# Patient Record
Sex: Female | Born: 1955 | Race: White | Hispanic: No | Marital: Married | Smoking: Never smoker
Health system: Southern US, Community
[De-identification: ages and names within clinical notes are randomized; demographics above are authoritative.]

## PROBLEM LIST (undated history)

## (undated) DIAGNOSIS — K219 Gastro-esophageal reflux disease without esophagitis: Secondary | ICD-10-CM

## (undated) DIAGNOSIS — B019 Varicella without complication: Secondary | ICD-10-CM

## (undated) HISTORY — DX: Gastro-esophageal reflux disease without esophagitis: K21.9

## (undated) HISTORY — PX: GALLBLADDER SURGERY: SHX652

## (undated) HISTORY — PX: ABDOMINAL HYSTERECTOMY: SHX81

## (undated) HISTORY — PX: TONSILLECTOMY: SUR1361

## (undated) HISTORY — DX: Varicella without complication: B01.9

## (undated) HISTORY — PX: REPLACEMENT TOTAL KNEE: SUR1224

---

## 2020-02-01 ENCOUNTER — Other Ambulatory Visit: Payer: Self-pay

## 2020-02-02 ENCOUNTER — Encounter: Payer: Self-pay | Admitting: Family Medicine

## 2020-02-02 ENCOUNTER — Encounter (INDEPENDENT_AMBULATORY_CARE_PROVIDER_SITE_OTHER): Payer: Self-pay

## 2020-02-02 ENCOUNTER — Ambulatory Visit (INDEPENDENT_AMBULATORY_CARE_PROVIDER_SITE_OTHER): Payer: BC Managed Care – PPO | Admitting: Family Medicine

## 2020-02-02 VITALS — BP 136/82 | HR 69 | Temp 97.0°F | Ht 66.25 in | Wt 225.2 lb

## 2020-02-02 DIAGNOSIS — Z0000 Encounter for general adult medical examination without abnormal findings: Secondary | ICD-10-CM | POA: Diagnosis not present

## 2020-02-02 DIAGNOSIS — Z23 Encounter for immunization: Secondary | ICD-10-CM

## 2020-02-02 DIAGNOSIS — Z1231 Encounter for screening mammogram for malignant neoplasm of breast: Secondary | ICD-10-CM | POA: Diagnosis not present

## 2020-02-02 DIAGNOSIS — R140 Abdominal distension (gaseous): Secondary | ICD-10-CM | POA: Diagnosis not present

## 2020-02-02 DIAGNOSIS — E669 Obesity, unspecified: Secondary | ICD-10-CM | POA: Diagnosis not present

## 2020-02-02 DIAGNOSIS — K219 Gastro-esophageal reflux disease without esophagitis: Secondary | ICD-10-CM

## 2020-02-02 DIAGNOSIS — N951 Menopausal and female climacteric states: Secondary | ICD-10-CM | POA: Diagnosis not present

## 2020-02-02 LAB — COMPREHENSIVE METABOLIC PANEL
ALT: 25 U/L (ref 0–35)
AST: 20 U/L (ref 0–37)
Albumin: 4.5 g/dL (ref 3.5–5.2)
Alkaline Phosphatase: 62 U/L (ref 39–117)
BUN: 16 mg/dL (ref 6–23)
CO2: 31 mEq/L (ref 19–32)
Calcium: 9.7 mg/dL (ref 8.4–10.5)
Chloride: 103 mEq/L (ref 96–112)
Creatinine, Ser: 1.04 mg/dL (ref 0.40–1.20)
GFR: 53.69 mL/min — ABNORMAL LOW (ref >60.00)
Glucose, Bld: 105 mg/dL — ABNORMAL HIGH (ref 70–99)
Potassium: 4.3 mEq/L (ref 3.5–5.1)
Sodium: 140 mEq/L (ref 135–145)
Total Bilirubin: 0.6 mg/dL (ref 0.2–1.2)
Total Protein: 6.4 g/dL (ref 6.0–8.3)

## 2020-02-02 LAB — CBC WITH DIFFERENTIAL/PLATELET
Basophils Absolute: 0 10*3/uL (ref 0.0–0.1)
Basophils Relative: 0.7 % (ref 0.0–3.0)
Eosinophils Absolute: 0.1 10*3/uL (ref 0.0–0.7)
Eosinophils Relative: 1.8 % (ref 0.0–5.0)
HCT: 45.7 % (ref 36.0–46.0)
Hemoglobin: 15.6 g/dL — ABNORMAL HIGH (ref 12.0–15.0)
Lymphocytes Relative: 29.8 % (ref 12.0–46.0)
Lymphs Abs: 1.2 10*3/uL (ref 0.7–4.0)
MCHC: 34 g/dL (ref 30.0–36.0)
MCV: 96 fl (ref 78.0–100.0)
Monocytes Absolute: 0.3 10*3/uL (ref 0.1–1.0)
Monocytes Relative: 8.2 % (ref 3.0–12.0)
Neutro Abs: 2.4 10*3/uL (ref 1.4–7.7)
Neutrophils Relative %: 59.5 % (ref 43.0–77.0)
Platelets: 184 10*3/uL (ref 150.0–400.0)
RBC: 4.76 Mil/uL (ref 3.87–5.11)
RDW: 13.1 % (ref 11.5–15.5)
WBC: 4 10*3/uL (ref 4.0–10.5)

## 2020-02-02 LAB — HEMOGLOBIN A1C: Hgb A1c MFr Bld: 5.9 % (ref 4.6–6.5)

## 2020-02-02 LAB — LIPID PANEL
Cholesterol: 238 mg/dL — ABNORMAL HIGH (ref 0–200)
HDL: 47.5 mg/dL (ref >39.00)
LDL Cholesterol: 158 mg/dL — ABNORMAL HIGH (ref 0–99)
NonHDL: 190.71
Total CHOL/HDL Ratio: 5
Triglycerides: 165 mg/dL — ABNORMAL HIGH (ref 0.0–149.0)
VLDL: 33 mg/dL (ref 0.0–40.0)

## 2020-02-02 LAB — TSH: TSH: 1.36 u[IU]/mL (ref 0.35–4.50)

## 2020-02-02 NOTE — Patient Instructions (Addendum)
Please return in 12 months for your annual complete physical; please come fasting.  Please sign up for Mychart.  I will release your lab results to you on your MyChart account with further instructions. Please reply with any questions.   Today you were given your tdap vaccination. It is good for 10 years.   You may try OTC Black cohosh twice a day and Valerian root nightly to help with your hot flushes and sleep.   I have ordered a mammogram and/or bone density for you as we discussed today: [x]   Mammogram  []   Bone Density  Please call the office checked below to schedule your appointment: Your appointment will at the following location  [x]   [facility]      [place], [place]         []   [facility]  [place], [place]  Make sure to wear two peace clothing  No lotions powders or deodorants the day of the appointment Make sure to bring picture ID and insurance card.  Bring list of medications you are currently taking including any supplements.   It was a pleasure meeting you today! Thank you for choosing [place] to meet your healthcare needs! I truly look forward to working with you. If you have any questions or concerns, please send me a message via Mychart or call the office at [phone].   Preventive Care 42-34 Years Old, Female Preventive care refers to visits with your health care provider and lifestyle choices that can promote health and wellness. This includes:  A yearly physical exam. This may also be called an annual well check.  Regular dental visits and eye exams.  Immunizations.  Screening for certain conditions.  Healthy lifestyle choices, such as eating a healthy diet, getting regular exercise, not using drugs or products that contain nicotine and tobacco, and limiting alcohol use. What can I expect for my preventive care visit? Physical exam Your  health care provider will check your:  Height and weight. This may be used to calculate body mass index (BMI), which tells if you are at a healthy weight.  Heart rate and blood pressure.  Skin for abnormal spots. Counseling Your health care provider may ask you questions about your:  Alcohol, tobacco, and drug use.  Emotional well-being.  Home and relationship well-being.  Sexual activity.  Eating habits.  Work and work Statistician.  Method of birth control.  Menstrual cycle.  Pregnancy history. What immunizations do I need?  Influenza (flu) vaccine  This is recommended every year. Tetanus, diphtheria, and pertussis (Tdap) vaccine  You may need a Td booster every 10 years. Varicella (chickenpox) vaccine  You may need this if you have not been vaccinated. Zoster (shingles) vaccine  You may need this after age 53. Measles, mumps, and rubella (MMR) vaccine  You may need at least one dose of MMR if you were born in 1957 or later. You may also need a second dose. Pneumococcal conjugate (PCV13) vaccine  You may need this if you have certain conditions and were not previously vaccinated. Pneumococcal polysaccharide (PPSV23) vaccine  You may need one or two doses if you smoke cigarettes or if you have certain conditions. Meningococcal conjugate (MenACWY) vaccine  You may need this if you have certain conditions. Hepatitis A vaccine  You may need this if you  have certain conditions or if you travel or work in places where you may be exposed to hepatitis A. Hepatitis B vaccine  You may need this if you have certain conditions or if you travel or work in places where you may be exposed to hepatitis B. Haemophilus influenzae type b (Hib) vaccine  You may need this if you have certain conditions. Human papillomavirus (HPV) vaccine  If recommended by your health care provider, you may need three doses over 6 months. You may receive vaccines as individual doses or as  more than one vaccine together in one shot (combination vaccines). Talk with your health care provider about the risks and benefits of combination vaccines. What tests do I need? Blood tests  Lipid and cholesterol levels. These may be checked every 5 years, or more frequently if you are over 81 years old.  Hepatitis C test.  Hepatitis B test. Screening  Lung cancer screening. You may have this screening every year starting at age 43 if you have a 30-pack-year history of smoking and currently smoke or have quit within the past 15 years.  Colorectal cancer screening. All adults should have this screening starting at age 71 and continuing until age 24. Your health care provider may recommend screening at age 43 if you are at increased risk. You will have tests every 1-10 years, depending on your results and the type of screening test.  Diabetes screening. This is done by checking your blood sugar (glucose) after you have not eaten for a while (fasting). You may have this done every 1-3 years.  Mammogram. This may be done every 1-2 years. Talk with your health care provider about when you should start having regular mammograms. This may depend on whether you have a family history of breast cancer.  BRCA-related cancer screening. This may be done if you have a family history of breast, ovarian, tubal, or peritoneal cancers.  Pelvic exam and Pap test. This may be done every 3 years starting at age 47. Starting at age 22, this may be done every 5 years if you have a Pap test in combination with an HPV test. Other tests  Sexually transmitted disease (STD) testing.  Bone density scan. This is done to screen for osteoporosis. You may have this scan if you are at high risk for osteoporosis. Follow these instructions at home: Eating and drinking  Eat a diet that includes fresh fruits and vegetables, whole grains, lean protein, and low-fat dairy.  Take vitamin and mineral supplements as  recommended by your health care provider.  Do not drink alcohol if: ? Your health care provider tells you not to drink. ? You are pregnant, may be pregnant, or are planning to become pregnant.  If you drink alcohol: ? Limit how much you have to 0-1 drink a day. ? Be aware of how much alcohol is in your drink. In the [place] one drink equals one 12 oz bottle of beer (355 mL), one 5 oz glass of wine (148 mL), or one 1 oz glass of hard liquor (44 mL). Lifestyle  Take daily care of your teeth and gums.  Stay active. Exercise for at least 30 minutes on 5 or more days each week.  Do not use any products that contain nicotine or tobacco, such as cigarettes, e-cigarettes, and chewing tobacco. If you need help quitting, ask your health care provider.  If you are sexually active, practice safe sex. Use a condom or other form of birth control (contraception)  in order to prevent pregnancy and STIs (sexually transmitted infections).  If told by your health care provider, take low-dose aspirin daily starting at age 49. What's next?  Visit your health care provider once a year for a well check visit.  Ask your health care provider how often you should have your eyes and teeth checked.  Stay up to date on all vaccines. This information is not intended to replace advice given to you by your health care provider. Make sure you discuss any questions you have with your health care provider. Document Revised: 07/23/2018 Document Reviewed: 07/23/2018 Elsevier Patient Education  [place].   Irritable Bowel Syndrome, Adult  Irritable bowel syndrome (IBS) is a group of symptoms that affects the organs responsible for digestion (gastrointestinal or GI tract). IBS is not one specific disease. To regulate how the GI tract works, the body sends signals back and forth between the intestines and the brain. If you have IBS, there may be a problem with these signals. As a result, the GI tract does not  function normally. The intestines may become more sensitive and overreact to certain things. This may be especially true when you eat certain foods or when you are under stress. There are four types of IBS. These may be determined based on the consistency of your stool (feces):  IBS with diarrhea.  IBS with constipation.  Mixed IBS.  Unsubtyped IBS. It is important to know which type of IBS you have. Certain treatments are more likely to be helpful for certain types of IBS. What are the causes? The exact cause of IBS is not known. What increases the risk? You may have a higher risk for IBS if you:  Are female.  Are younger than 23.  Have a family history of IBS.  Have a mental health condition, such as depression, anxiety, or post-traumatic stress disorder.  Have had a bacterial infection of your GI tract. What are the signs or symptoms? Symptoms of IBS vary from person to person. The main symptom is abdominal pain or discomfort. Other symptoms usually include one or more of the following:  Diarrhea, constipation, or both.  Abdominal swelling or bloating.  Feeling full after eating a small or regular-sized meal.  Frequent gas.  Mucus in the stool.  A feeling of having more stool left after a bowel movement. Symptoms tend to come and go. They may be triggered by stress, mental health conditions, or certain foods. How is this diagnosed? This condition may be diagnosed based on a physical exam, your medical history, and your symptoms. You may have tests, such as:  Blood tests.  Stool test.  X-rays.  CT scan.  Colonoscopy. This is a procedure in which your GI tract is viewed with a long, thin, flexible tube. How is this treated? There is no cure for IBS, but treatment can help relieve symptoms. Treatment depends on the type of IBS you have, and may include:  Changes to your diet, such as: ? Avoiding foods that cause symptoms. ? Drinking more water. ? Following a  low-FODMAP (fermentable oligosaccharides, disaccharides, monosaccharides, and polyols) diet for up to 6 weeks, or as told by your health care provider. FODMAPs are sugars that are hard for some people to digest. ? Eating more fiber. ? Eating medium-sized meals at the same times every day.  Medicines. These may include: ? Fiber supplements, if you have constipation. ? Medicine to control diarrhea (antidiarrheal medicines). ? Medicine to help control muscle tightening (spasms)  in your GI tract (antispasmodic medicines). ? Medicines to help with mental health conditions, such as antidepressants or tranquilizers.  Talk therapy or counseling.  Working with a diet and nutrition specialist (dietitian) to help create a food plan that is right for you.  Managing your stress. Follow these instructions at home: Eating and drinking  Eat a healthy diet.  Eat medium-sized meals at about the same time every day. Do not eat large meals.  Gradually eat more fiber-rich foods. These include whole grains, fruits, and vegetables. This may be especially helpful if you have IBS with constipation.  Eat a diet low in FODMAPs.  Drink enough fluid to keep your urine pale yellow.  Keep a journal of foods that seem to trigger symptoms.  Avoid foods and drinks that: ? Contain added sugar. ? Make your symptoms worse. Dairy products, caffeinated drinks, and carbonated drinks can make symptoms worse for some people. General instructions  Take over-the-counter and prescription medicines and supplements only as told by your health care provider.  Get enough exercise. Do at least 150 minutes of moderate-intensity exercise each week.  Manage your stress. Getting enough sleep and exercise can help you manage stress.  Keep all follow-up visits as told by your health care provider and therapist. This is important. Alcohol Use  Do not drink alcohol if: ? Your health care provider tells you not to drink. ? You  are pregnant, may be pregnant, or are planning to become pregnant.  If you drink alcohol, limit how much you have: ? 0-1 drink a day for women. ? 0-2 drinks a day for men.  Be aware of how much alcohol is in your drink. In the [place] one drink equals one typical bottle of beer (12 oz), one-half glass of wine (5 oz), or one shot of hard liquor (1 oz). Contact a health care provider if you have:  Constant pain.  Weight loss.  Difficulty or pain when swallowing.  Diarrhea that gets worse. Get help right away if you have:  Severe abdominal pain.  Fever.  Diarrhea with symptoms of dehydration, such as dizziness or dry mouth.  Bright red blood in your stool.  Stool that is black and tarry.  Abdominal swelling.  Vomiting that does not stop.  Blood in your vomit. Summary  Irritable bowel syndrome (IBS) is not one specific disease. It is a group of symptoms that affects digestion.  Your intestines may become more sensitive and overreact to certain things. This may be especially true when you eat certain foods or when you are under stress.  There is no cure for IBS, but treatment can help relieve symptoms. This information is not intended to replace advice given to you by your health care provider. Make sure you discuss any questions you have with your health care provider. Document Revised: 11/04/2017 Document Reviewed: 11/04/2017 Elsevier Patient Education  2020 [employer].

## 2020-02-02 NOTE — Progress Notes (Signed)
Subjective  Chief Complaint  Patient presents with  . New Patient (Initial Visit)    moved form [place]. seen Dr. Gerhard Perches and Dr. Lethea Killings in 2019. CPE done over 2 years ago  . Gastroesophageal Reflux    bloating for years. does not take any medication for GERD    HPI: Brittany Lamb is a 64 y.o. female who presents to [facility] [place] today for a Female Wellness Visit. She also has the concerns and/or needs as listed above in the chief complaint. These will be addressed in addition to the Health Maintenance Visit. New pt to establish care.   Wellness Visit: annual visit with health maintenance review and exam without Pap   HM: last cpe 2 years ago. No records for review. Struggles with weight: chronically. Due for mammogram, tdap. Reports had colonoscopy but not sure when (believes 2 years ago) and it was normal. Will request records.  Chronic disease f/u and/or acute problem visit: (deemed necessary to be done in addition to the wellness visit):  Bloating: chronically > 10 years. Evaluated in past by GI and surgeon. Had cholecystectomy w/o change in sxs. Had EGD and colonoscopy. Reports both normal. Variable. Struggles some with intermittent constipation. No blood in stool. Can't make a pattern to sxs. No cramping or diarrhea. No change in abdominal girth. S/o complete hysterectomy  GERD: reports heartburn and reflux. Has been on h2blockers and PPIs, multiple w/o change in sxs. Protonix gave her leg cramps. No h/o PUD.   Premature menopause early 3s; then had complete hysterectomy for bleeding; + uterine cancer in both sister and mother. Has persistent hot flushes. Prefers herbal or alternative supplements over RX meds. Takes melatonin for sleep.   Assessment  1. Annual physical exam   2. Encounter for screening mammogram for breast cancer   3. Need for Tdap vaccination   4. Obesity (BMI 30-39.9)   5. Abdominal bloating   6. Gastroesophageal reflux  disease without esophagitis   7. Menopausal hot flushes      Plan  Female Wellness Visit:  Age appropriate Health Maintenance and Prevention measures were discussed with patient. Included topics are cancer screening recommendations, ways to keep healthy (see AVS) including dietary and exercise recommendations, regular eye and dental care, use of seat belts, and avoidance of moderate alcohol use and tobacco use. mammo ordered  BMI: discussed patient's BMI and encouraged positive lifestyle modifications to help get to or maintain a target BMI. rec weight loss  HM needs and immunizations were addressed and ordered. See below for orders. See HM and immunization section for updates. tdap today. Declines flu shot  Routine labs and screening tests ordered including cmp, cbc and lipids where appropriate.  Discussed recommendations regarding Vit D and calcium supplementation (see AVS)  Chronic disease management visit and/or acute problem visit:  GERD: ? Treatment options since nothing has been helpful and reportedly normal EGD. May need GI referral. Will request records first.   Bloating and constipation: most c/w IBS sxs. Trial of probiotics and daily miralax.   Hot flushes: trial of black cohosh and valerian root.   Follow up: Return in about 1 year (around 02/01/2021) for complete physical.  Orders Placed This Encounter  Procedures  . MM 3D SCREEN BREAST BILATERAL  . Tdap vaccine greater than or equal to 7yo IM  . CBC with Differential/Platelet  . Comprehensive metabolic panel  . Lipid panel  . HIV Antibody (routine testing w rflx)  . Hepatitis  C antibody  . TSH  . Hemoglobin A1c   No orders of the defined types were placed in this encounter.     Lifestyle: Body mass index is 36.07 kg/m. Wt Readings from Last 3 Encounters:  02/02/20 225 lb 3.2 oz (102.2 kg)     There are no problems to display for this patient.  Health Maintenance  Topic Date Due  . Hepatitis C  Screening  27-Oct-1956  . HIV Screening  01/20/1973  . TETANUS/TDAP  01/20/1977  . MAMMOGRAM  01/21/2008  . INFLUENZA VACCINE  02/23/2020 (Originally 06/26/2019)  . COLONOSCOPY  11/25/2026   There is no immunization history for the selected administration types on file for this patient. We updated and reviewed the patient's past history in detail and it is documented below. Allergies: Patient is allergic to penicillins. Past Medical History Patient  has a past medical history of Chicken pox and GERD (gastroesophageal reflux disease). Past Surgical History Patient  has a past surgical history that includes Abdominal hysterectomy; Replacement total knee (Left); Gallbladder surgery; and Tonsillectomy. Family History: Patient family history includes Arthritis in her brother, father, mother, sister, and son; Cancer in her mother and sister; Diabetes in her brother, mother, and sister; High Cholesterol in her brother, mother, and sister; High blood pressure in her brother, father, mother, and sister; Stroke in her father. Social History:  Patient  reports that she has never smoked. She has never used smokeless tobacco. She reports current alcohol use. She reports that she does not use drugs.  Review of Systems: Constitutional: negative for fever or malaise Ophthalmic: negative for photophobia, double vision or loss of vision Cardiovascular: negative for chest pain, dyspnea on exertion, or new LE swelling Respiratory: negative for SOB or persistent cough Gastrointestinal: + for abdominal pain, change in bowel habits or melena Genitourinary: negative for dysuria or gross hematuria, no abnormal uterine bleeding or disharge Musculoskeletal: negative for new gait disturbance or muscular weakness Integumentary: negative for new or persistent rashes, no breast lumps Neurological: negative for TIA or stroke symptoms Psychiatric: negative for SI or delusions Allergic/Immunologic: negative for  hives  Patient Care Team    Relationship Specialty Notifications Start End  Willow Ora, MD PCP - General Family Medicine  02/02/20     Objective  Vitals: BP 136/82 (BP Location: Right Arm, Patient Position: Sitting, Cuff Size: Normal)   Pulse 69   Temp (!) 97 F (36.1 C) (Temporal)   Ht 5' 6.25" (1.683 m)   Wt 225 lb 3.2 oz (102.2 kg)   SpO2 95%   BMI 36.07 kg/m  General:  Well developed, well nourished, no acute distress  Psych:  Alert and orientedx3,normal mood and affect HEENT:  Normocephalic, atraumatic, non-icteric sclera, PERRL, supple neck without adenopathy, mass or thyromegaly Cardiovascular:  Normal S1, S2, RRR without gallop, rub or murmur, nondisplaced PMI Respiratory:  Good breath sounds bilaterally, CTAB with normal respiratory effort Gastrointestinal: normal bowel sounds, soft, non-tender, no noted masses. No HSM MSK: no deformities, contusions. Joints are without erythema or swelling.  Skin:  Warm, no rashes or suspicious lesions noted Neurologic:    Mental status is normal. CN 2-11 are normal. Gross motor and sensory exams are normal. Normal gait. No tremor Breast Exam: No mass, skin retraction or nipple discharge is appreciated in either breast. No axillary adenopathy. Fibrocystic changes are not noted   Commons side effects, risks, benefits, and alternatives for medications and treatment plan prescribed today were discussed, and the patient expressed understanding of  the given instructions. Patient is instructed to call or message via MyChart if he/she has any questions or concerns regarding our treatment plan. No barriers to understanding were identified. We discussed Red Flag symptoms and signs in detail. Patient expressed understanding regarding what to do in case of urgent or emergency type symptoms.   Medication list was reconciled, printed and provided to the patient in AVS. Patient instructions and summary information was reviewed with the patient as  documented in the AVS. This note was prepared with assistance of Dragon voice recognition software. Occasional wrong-word or sound-a-like substitutions may have occurred due to the inherent limitations of voice recognition software  This visit occurred during the SARS-CoV-2 public health emergency.  Safety protocols were in place, including screening questions prior to the visit, additional usage of staff PPE, and extensive cleaning of exam room while observing appropriate contact time as indicated for disinfecting solutions.

## 2020-02-03 LAB — HEPATITIS C ANTIBODY
Hepatitis C Ab: NONREACTIVE
SIGNAL TO CUT-OFF: 0 (ref <1.00)

## 2020-02-03 LAB — HIV ANTIBODY (ROUTINE TESTING W REFLEX): HIV 1&2 Ab, 4th Generation: NONREACTIVE

## 2020-02-04 ENCOUNTER — Encounter: Payer: Self-pay | Admitting: Family Medicine

## 2020-02-04 NOTE — Progress Notes (Signed)
Please call patient: I have reviewed his/her lab results. All lab results look ok, however sugar is just above normal and cholesterol levels are high. Neither yet warrant medication, but eating an improved diet without fats, sugars and processed foods will prevent this from worsening. Can send her a copy of results if she'd like; she did not sign up for mychart.  The 10-year ASCVD risk score Denman George DC Montez Hageman., et al., 2013) is: 5.6%   Values used to calculate the score:     Age: 15 years     Sex: Female     Is Non-Hispanic African American: No     Diabetic: No     Tobacco smoker: No     Systolic Blood Pressure: 136 mmHg     Is BP treated: No     HDL Cholesterol: 47.5 mg/dL     Total Cholesterol: 238 mg/dL

## 2020-02-07 ENCOUNTER — Telehealth: Payer: Self-pay | Admitting: Family Medicine

## 2020-02-07 NOTE — Telephone Encounter (Signed)
Error

## 2020-02-15 ENCOUNTER — Ambulatory Visit: Payer: Self-pay | Admitting: Family Medicine

## 2020-04-28 ENCOUNTER — Ambulatory Visit: Payer: Self-pay | Admitting: Family Medicine

## 2020-08-09 ENCOUNTER — Ambulatory Visit: Payer: BC Managed Care – PPO | Admitting: Family Medicine

## 2020-08-14 ENCOUNTER — Ambulatory Visit: Payer: Managed Care, Other (non HMO) | Admitting: Family Medicine

## 2020-08-14 DIAGNOSIS — Z0289 Encounter for other administrative examinations: Secondary | ICD-10-CM

## 2020-08-16 ENCOUNTER — Other Ambulatory Visit: Payer: Self-pay

## 2020-08-16 ENCOUNTER — Ambulatory Visit (INDEPENDENT_AMBULATORY_CARE_PROVIDER_SITE_OTHER): Payer: Managed Care, Other (non HMO) | Admitting: Family Medicine

## 2020-08-16 ENCOUNTER — Encounter: Payer: Self-pay | Admitting: Family Medicine

## 2020-08-16 VITALS — BP 118/88 | HR 70 | Temp 98.4°F | Wt 231.8 lb

## 2020-08-16 DIAGNOSIS — E669 Obesity, unspecified: Secondary | ICD-10-CM

## 2020-08-16 DIAGNOSIS — K581 Irritable bowel syndrome with constipation: Secondary | ICD-10-CM

## 2020-08-16 DIAGNOSIS — R0789 Other chest pain: Secondary | ICD-10-CM | POA: Diagnosis not present

## 2020-08-16 DIAGNOSIS — K219 Gastro-esophageal reflux disease without esophagitis: Secondary | ICD-10-CM | POA: Insufficient documentation

## 2020-08-16 DIAGNOSIS — N951 Menopausal and female climacteric states: Secondary | ICD-10-CM

## 2020-08-16 DIAGNOSIS — R140 Abdominal distension (gaseous): Secondary | ICD-10-CM

## 2020-08-16 MED ORDER — LUBIPROSTONE 8 MCG PO CAPS: 8.0000 ug | ORAL_CAPSULE | Freq: Two times a day (BID) | ORAL | 2 refills | Status: DC

## 2020-08-16 NOTE — Patient Instructions (Addendum)
Please return in March 2022 for complete physical.  If you have any questions or concerns, please don't hesitate to send me a message via MyChart or call the office at [phone]. Thank you for visiting with [place] today! It's our pleasure caring for you.  I have placed a referral to GI to further evaluate your symptoms and offer treatment. I have ordered a medication to see if it helps with your constipation and bloating symptoms.  I have ordered a treadmill stress test as well. We will call you to get you scheduled.   Try this diet to help with the bloating. This is good for IBS. FODMAP stands for "fermentable oligodi-monosaccharides and polyols," or, more simply, certain types of carbohydrates found in foods that are hard to digest. By following FODMAP, also known as a low-FODMAP diet, you avoid or limit these particular carbohydrates. Some of the foods that contain FODMAPs include: - Fruits such as apples, apricots, blackberries, cherries, mango, nectarines, pears, plums, and watermelon, or juice containing any of these fruits - Canned fruit in natural fruit juice, or large quantities of fruit juice or dried fruit - Vegentables such as artichokes, asparagus, beans, cabbage, cauliflower, garlic and garlic salts, lentils, mushrooms, onions, and sugar snap or snow peas - Dairy products such as milk, milk products, soft cheese, yogurt, custard, and ice cream - Wheat and rye products - Honey and foods with high-fructose corn syrup  - Products, including candy and gum, with sweeteners ending in "-ol" (for example, sorbitol, mannitol, xylitol, and maltitol)   My favorite websites for more information is: FindScifi.com.ee   Low-FODMAP Eating Plan  FODMAPs (fermentable oligosaccharides, disaccharides, monosaccharides, and polyols) are sugars that are hard for some people to digest. A low-FODMAP eating plan may help some people who have bowel (intestinal) diseases to manage their  symptoms. This meal plan can be complicated to follow. Work with a diet and nutrition specialist (dietitian) to make a low-FODMAP eating plan that is right for you. A dietitian can make sure that you get enough nutrition from this diet. What are tips for following this plan? Reading food labels  Check labels for hidden FODMAPs such as: ? High-fructose syrup. ? Honey. ? Agave. ? Natural fruit flavors. ? Onion or garlic powder.  Choose low-FODMAP foods that contain 3-4 grams of fiber per serving.  Check food labels for serving sizes. Eat only one serving at a time to make sure FODMAP levels stay low. Meal planning  Follow a low-FODMAP eating plan for up to 6 weeks, or as told by your health care provider or dietitian.  To follow the eating plan: 1. Eliminate high-FODMAP foods from your diet completely. 2. Gradually reintroduce high-FODMAP foods into your diet one at a time. Most people should wait a few days after introducing one high-FODMAP food before they introduce the next high-FODMAP food. Your dietitian can recommend how quickly you may reintroduce foods. 3. Keep a daily record of what you eat and drink, and make note of any symptoms that you have after eating. 4. Review your daily record with a dietitian regularly. Your dietitian can help you identify which foods you can eat and which foods you should avoid. General tips  Drink enough fluid each day to keep your urine pale yellow.  Avoid processed foods. These often have added sugar and may be high in FODMAPs.  Avoid most dairy products, whole grains, and sweeteners.  Work with a dietitian to make sure you get enough fiber in your diet. Recommended  foods Grains  Gluten-free grains, such as rice, oats, buckwheat, quinoa, corn, polenta, and millet. Gluten-free pasta, bread, or cereal. Rice noodles. Corn tortillas. Vegetables  Eggplant, zucchini, cucumber, peppers, green beans, Brussels sprouts, bean sprouts, lettuce,  arugula, kale, Swiss chard, spinach, collard greens, bok choy, summer squash, potato, and tomato. Limited amounts of corn, carrot, and sweet potato. Green parts of scallions. Fruits  Bananas, oranges, lemons, limes, blueberries, raspberries, strawberries, grapes, cantaloupe, honeydew melon, kiwi, papaya, passion fruit, and pineapple. Limited amounts of dried cranberries, banana chips, and shredded coconut. Dairy  Lactose-free milk, yogurt, and kefir. Lactose-free cottage cheese and ice cream. Non-dairy milks, such as almond, coconut, hemp, and rice milk. Yogurts made of non-dairy milks. Limited amounts of goat cheese, brie, mozzarella, parmesan, swiss, and other hard cheeses. Meats and other protein foods  Unseasoned beef, pork, poultry, or fish. Eggs. Tomasa Blase. Tofu (firm) and tempeh. Limited amounts of nuts and seeds, such as almonds, walnuts, [place] nuts, pecans, peanuts, pumpkin seeds, chia seeds, and sunflower seeds. Fats and oils  Butter-free spreads. Vegetable oils, such as olive, canola, and sunflower oil. Seasoning and other foods  Artificial sweeteners with names that do not end in "ol" such as aspartame, saccharine, and stevia. Maple syrup, white table sugar, raw sugar, brown sugar, and molasses. Fresh basil, coriander, parsley, rosemary, and thyme. Beverages  Water and mineral water. Sugar-sweetened soft drinks. Small amounts of orange juice or cranberry juice. Black and green tea. Most dry wines. Coffee. This may not be a complete list of low-FODMAP foods. Talk with your dietitian for more information. Foods to avoid Grains  Wheat, including kamut, durum, and semolina. Barley and bulgur. Couscous. Wheat-based cereals. Wheat noodles, bread, crackers, and pastries. Vegetables  Chicory root, artichoke, asparagus, cabbage, snow peas, sugar snap peas, mushrooms, and cauliflower. Onions, garlic, leeks, and the white part of scallions. Fruits  Fresh, dried, and juiced forms of apple,  pear, watermelon, peach, plum, cherries, apricots, blackberries, boysenberries, figs, nectarines, and mango. Avocado. Dairy  Milk, yogurt, ice cream, and soft cheese. Cream and sour cream. Milk-based sauces. Custard. Meats and other protein foods  Fried or fatty meat. Sausage. Cashews and pistachios. Soybeans, baked beans, black beans, chickpeas, kidney beans, fava beans, navy beans, lentils, and split peas. Seasoning and other foods  Any sugar-free gum or candy. Foods that contain artificial sweeteners such as sorbitol, mannitol, isomalt, or xylitol. Foods that contain honey, high-fructose corn syrup, or agave. Bouillon, vegetable stock, beef stock, and chicken stock. Garlic and onion powder. Condiments made with onion, such as hummus, chutney, pickles, relish, salad dressing, and salsa. Tomato paste. Beverages  Chicory-based drinks. Coffee substitutes. Chamomile tea. Fennel tea. Sweet or fortified wines such as port or sherry. Diet soft drinks made with isomalt, mannitol, maltitol, sorbitol, or xylitol. Apple, pear, and mango juice. Juices with high-fructose corn syrup. This may not be a complete list of high-FODMAP foods. Talk with your dietitian to discuss what dietary choices are best for you.  Summary  A low-FODMAP eating plan is a short-term diet that eliminates FODMAPs from your diet to help ease symptoms of certain bowel diseases.  The eating plan usually lasts up to 6 weeks. After that, high-FODMAP foods are restarted gradually, one at a time, so you can find out which may be causing symptoms.  A low-FODMAP eating plan can be complicated. It is best to work with a dietitian who has experience with this type of plan. This information is not intended to replace advice given to you by your  health care provider. Make sure you discuss any questions you have with your health care provider. Document Revised: 10/24/2017 Document Reviewed: 07/08/2017 Elsevier Patient Education  2020 [employer]   Irritable Bowel Syndrome, Adult  Irritable bowel syndrome (IBS) is a group of symptoms that affects the organs responsible for digestion (gastrointestinal or GI tract). IBS is not one specific disease. To regulate how the GI tract works, the body sends signals back and forth between the intestines and the brain. If you have IBS, there may be a problem with these signals. As a result, the GI tract does not function normally. The intestines may become more sensitive and overreact to certain things. This may be especially true when you eat certain foods or when you are under stress. There are four types of IBS. These may be determined based on the consistency of your stool (feces):  IBS with diarrhea.  IBS with constipation.  Mixed IBS.  Unsubtyped IBS. It is important to know which type of IBS you have. Certain treatments are more likely to be helpful for certain types of IBS. What are the causes? The exact cause of IBS is not known. What increases the risk? You may have a higher risk for IBS if you:  Are female.  Are younger than 2140.  Have a family history of IBS.  Have a mental health condition, such as depression, anxiety, or post-traumatic stress disorder.  Have had a bacterial infection of your GI tract. What are the signs or symptoms? Symptoms of IBS vary from person to person. The main symptom is abdominal pain or discomfort. Other symptoms usually include one or more of the following:  Diarrhea, constipation, or both.  Abdominal swelling or bloating.  Feeling full after eating a small or regular-sized meal.  Frequent gas.  Mucus in the stool.  A feeling of having more stool left after a bowel movement. Symptoms tend to come and go. They may be triggered by stress, mental health conditions, or certain foods. How is this diagnosed? This condition may be diagnosed based on a physical exam, your medical history, and your symptoms. You may have tests, such  as:  Blood tests.  Stool test.  X-rays.  CT scan.  Colonoscopy. This is a procedure in which your GI tract is viewed with a long, thin, flexible tube. How is this treated? There is no cure for IBS, but treatment can help relieve symptoms. Treatment depends on the type of IBS you have, and may include:  Changes to your diet, such as: ? Avoiding foods that cause symptoms. ? Drinking more water. ? Following a low-FODMAP (fermentable oligosaccharides, disaccharides, monosaccharides, and polyols) diet for up to 6 weeks, or as told by your health care provider. FODMAPs are sugars that are hard for some people to digest. ? Eating more fiber. ? Eating medium-sized meals at the same times every day.  Medicines. These may include: ? Fiber supplements, if you have constipation. ? Medicine to control diarrhea (antidiarrheal medicines). ? Medicine to help control muscle tightening (spasms) in your GI tract (antispasmodic medicines). ? Medicines to help with mental health conditions, such as antidepressants or tranquilizers.  Talk therapy or counseling.  Working with a diet and nutrition specialist (dietitian) to help create a food plan that is right for you.  Managing your stress. Follow these instructions at home: Eating and drinking  Eat a healthy diet.  Eat medium-sized meals at about the same time every day. Do not eat large meals.  Gradually  eat more fiber-rich foods. These include whole grains, fruits, and vegetables. This may be especially helpful if you have IBS with constipation.  Eat a diet low in FODMAPs.  Drink enough fluid to keep your urine pale yellow.  Keep a journal of foods that seem to trigger symptoms.  Avoid foods and drinks that: ? Contain added sugar. ? Make your symptoms worse. Dairy products, caffeinated drinks, and carbonated drinks can make symptoms worse for some people. General instructions  Take over-the-counter and prescription medicines and  supplements only as told by your health care provider.  Get enough exercise. Do at least 150 minutes of moderate-intensity exercise each week.  Manage your stress. Getting enough sleep and exercise can help you manage stress.  Keep all follow-up visits as told by your health care provider and therapist. This is important. Alcohol Use  Do not drink alcohol if: ? Your health care provider tells you not to drink. ? You are pregnant, may be pregnant, or are planning to become pregnant.  If you drink alcohol, limit how much you have: ? 0-1 drink a day for women. ? 0-2 drinks a day for men.  Be aware of how much alcohol is in your drink. In the [place] one drink equals one typical bottle of beer (12 oz), one-half glass of wine (5 oz), or one shot of hard liquor (1 oz). Contact a health care provider if you have:  Constant pain.  Weight loss.  Difficulty or pain when swallowing.  Diarrhea that gets worse. Get help right away if you have:  Severe abdominal pain.  Fever.  Diarrhea with symptoms of dehydration, such as dizziness or dry mouth.  Bright red blood in your stool.  Stool that is black and tarry.  Abdominal swelling.  Vomiting that does not stop.  Blood in your vomit. Summary  Irritable bowel syndrome (IBS) is not one specific disease. It is a group of symptoms that affects digestion.  Your intestines may become more sensitive and overreact to certain things. This may be especially true when you eat certain foods or when you are under stress.  There is no cure for IBS, but treatment can help relieve symptoms. This information is not intended to replace advice given to you by your health care provider. Make sure you discuss any questions you have with your health care provider. Document Revised: 11/04/2017 Document Reviewed: 11/04/2017 Elsevier Patient Education  2020 [employer].

## 2020-08-16 NOTE — Progress Notes (Signed)
Subjective  CC:  Chief Complaint  Patient presents with  . Gastroesophageal Reflux    x hiatal hernia, does not currently have GI  . Bloated    several times a month  . Chest Pain    mostly when she lays down at night  . Numbness    right hand    HPI: Brittany Lamb is a 64 y.o. female who presents to the office today to address the problems listed above in the chief complaint.  64 year old female complains of abdominal bloating and chest pain while laying down at night.  Knows that she has hiatal hernia.  Has reflux symptoms.  No shortness of breath.  Occasional have right hand numbness.  She has chronical GERD and abdominal bloating symptoms for several years.  Reviewed note from March.  No history of cardiovascular disease.  She denies fevers, shortness of breath, pleuritic chest pain, nausea, vomiting, diarrhea.  Chronic constipation   Assessment  1. Atypical chest pain   2. Abdominal bloating   3. Gastroesophageal reflux disease without esophagitis   4. Obesity (BMI 30-39.9)   5. Menopausal hot flushes   6. Irritable bowel syndrome with predominant constipation      Plan   Atypical chest pain: Likely related to GI symptoms.  Education given.  If worsening chest pain or shortness of breath, reevaluation urgently recommended.  No shortness of breath.  Consider PE.  No calf tenderness or swelling.  Check lab work.  Treat for GERD: Start PPI  Likely IBS with predominant constipation: Likely the cause of her intermittent abdominal symptoms.  Start Amitiza.  Recommend referral to GI.  Follow up: Return in about 6 months (around 02/13/2021) for complete physical.  02/07/2021  Orders Placed This Encounter  Procedures  . Ambulatory referral to Gastroenterology  . Exercise Tolerance Test  . EKG 12-Lead   Meds ordered this encounter  Medications  . lubiprostone (AMITIZA) 8 MCG capsule    Sig: Take 1 capsule (8 mcg total) by mouth 2 (two) times daily with a meal.     Dispense:  60 capsule    Refill:  2      I reviewed the patients updated PMH, FH, and SocHx.    Patient Active Problem List   Diagnosis Date Noted  . Abdominal bloating 08/16/2020  . Gastroesophageal reflux disease without esophagitis 08/16/2020  . Menopausal hot flushes 08/16/2020  . Obesity (BMI 30-39.9) 08/16/2020   Current Meds  Medication Sig  . Cholecalciferol (VITAMIN D3) 25 MCG (1000 UT) CAPS Take by mouth.  . L-Lysine 1000 MG TABS Take by mouth.  . Multiple Vitamins-Minerals (MULTIPLE VITAMINS/WOMENS PO) Take by mouth.  . Red Yeast Rice Extract (RED YEAST RICE PO) Take by mouth.    Allergies: Patient is allergic to penicillins. Family History: Patient family history includes Arthritis in her brother, father, mother, sister, and son; Cancer in her mother and sister; Diabetes in her brother, mother, and sister; High Cholesterol in her brother, mother, and sister; High blood pressure in her brother, father, mother, and sister; Stroke in her father. Social History:  Patient  reports that she has never smoked. She has never used smokeless tobacco. She reports current alcohol use. She reports that she does not use drugs.  Review of Systems: Constitutional: Negative for fever malaise or anorexia Cardiovascular: negative for chest pain Respiratory: negative for SOB or persistent cough Gastrointestinal:  +for abdominal pain  Objective  Vitals: BP 118/88   Pulse 70   Temp 98.4  F (36.9 C) (Temporal)   Wt 231 lb 12.8 oz (105.1 kg)   SpO2 95%   BMI 37.13 kg/m  General: no acute distress , A&Ox3 HEENT: PEERL, conjunctiva normal, neck is supple Cardiovascular:  RRR without murmur or gallop.  Respiratory:  Good breath sounds bilaterally, CTAB with normal respiratory effort Abdominal exam: Soft, diffuse mild tenderness without guarding rebound or masses.  Normal bowel sounds. Skin:  Warm, no rashes  EKG: Normal sinus rhythm without acute changes.  No  comparison.   Commons side effects, risks, benefits, and alternatives for medications and treatment plan prescribed today were discussed, and the patient expressed understanding of the given instructions. Patient is instructed to call or message via MyChart if he/she has any questions or concerns regarding our treatment plan. No barriers to understanding were identified. We discussed Red Flag symptoms and signs in detail. Patient expressed understanding regarding what to do in case of urgent or emergency type symptoms.   Medication list was reconciled, printed and provided to the patient in AVS. Patient instructions and summary information was reviewed with the patient as documented in the AVS. This note was prepared with assistance of Dragon voice recognition software. Occasional wrong-word or sound-a-like substitutions may have occurred due to the inherent limitations of voice recognition software  This visit occurred during the SARS-CoV-2 public health emergency.  Safety protocols were in place, including screening questions prior to the visit, additional usage of staff PPE, and extensive cleaning of exam room while observing appropriate contact time as indicated for disinfecting solutions.

## 2020-09-03 ENCOUNTER — Encounter: Payer: Self-pay | Admitting: Family Medicine

## 2020-09-17 ENCOUNTER — Encounter: Payer: Self-pay | Admitting: Family Medicine

## 2020-09-22 ENCOUNTER — Encounter: Payer: Self-pay | Admitting: Physician Assistant

## 2020-10-09 ENCOUNTER — Ambulatory Visit (INDEPENDENT_AMBULATORY_CARE_PROVIDER_SITE_OTHER): Payer: Managed Care, Other (non HMO) | Admitting: Physician Assistant

## 2020-10-09 ENCOUNTER — Encounter: Payer: Self-pay | Admitting: Physician Assistant

## 2020-10-09 VITALS — BP 124/82 | HR 77 | Ht 66.25 in | Wt 231.2 lb

## 2020-10-09 DIAGNOSIS — K219 Gastro-esophageal reflux disease without esophagitis: Secondary | ICD-10-CM | POA: Diagnosis not present

## 2020-10-09 DIAGNOSIS — R1310 Dysphagia, unspecified: Secondary | ICD-10-CM

## 2020-10-09 DIAGNOSIS — K5900 Constipation, unspecified: Secondary | ICD-10-CM

## 2020-10-09 MED ORDER — ESOMEPRAZOLE MAGNESIUM 40 MG PO PACK: 40.0000 mg | PACK | Freq: Every day | ORAL | 11 refills | Status: DC

## 2020-10-09 NOTE — Progress Notes (Signed)
Subjective:    Patient ID: Brittany Lamb, female    DOB: [birth year], 64 y.o.   MRN: [id number]  HPI Brittany Lamb is a pleasant 64 year old white female, new to GI today referred by Dr. Asencion Partridge for evaluation of GERD, dysphagia and constipation. Patient has had prior GI evaluation while living in [facility] and has had both EGD and colonoscopy she believes around 6 or 7 years ago.  These were done by a Dr. Ewing Schlein.  She was told she did not have any colon polyps, and does have a hiatal hernia. Patient says she had been on Protonix for some time and when she moved to [place] she was having a lot of problems with leg cramps, so stopped the Protonix to see if that may be causing it.  The like cramps resolved so she had not resumed the Protonix or other PPI. Over the past multiple months she has been having problems with belching burping gas and acid reflux.  She has frequent nocturnal reflux that will awaken her from sleep with coughing and choking.  Also with daytime symptoms of heartburn and indigestion and sour brash.  She is also noticed over the past 6 months or so episodes of solid food dysphagia.  She gets discomfort in the subxiphoid area that will radiate across the diaphragm.  She has not had episodes requiring regurgitation but frequently has to stop eating.  She has not had prior esophageal dilation. She is also had a raw or burning sensation in the subxiphoid and epigastric region, she has been using Pepto-Bismol as needed and baking soda as needed.  Also mentions increased cough recently. Regarding constipation that has been intermittent long-term and is associated with bloating.  She has used MiraLAX in the past with success but has not been on that regularly. Family history is negative for colon cancer and polyps as far she is aware. She is status post cholecystectomy  Review of Systems Pertinent positive and negative review of systems were noted in the above HPI  section.  All other review of systems was otherwise negative.  Outpatient Encounter Medications as of 10/09/2020  Medication Sig  . Cholecalciferol (VITAMIN D3) 25 MCG (1000 UT) CAPS Take 2 capsules by mouth daily.   . Multiple Vitamins-Minerals (MULTIPLE VITAMINS/WOMENS PO) Take 1 tablet by mouth daily.   . Red Yeast Rice Extract (RED YEAST RICE PO) Take 2 tablets by mouth daily.   Marland Kitchen esomeprazole (NEXIUM) 40 MG packet Take 40 mg by mouth daily before breakfast.  . [DISCONTINUED] L-Lysine 1000 MG TABS Take by mouth.  . [DISCONTINUED] lubiprostone (AMITIZA) 8 MCG capsule Take 1 capsule (8 mcg total) by mouth 2 (two) times daily with a meal.   No facility-administered encounter medications on file as of 10/09/2020.   Allergies  Allergen Reactions  . Penicillins Hives   Patient Active Problem List   Diagnosis Date Noted  . Abdominal bloating 08/16/2020  . Gastroesophageal reflux disease without esophagitis 08/16/2020  . Menopausal hot flushes 08/16/2020  . Obesity (BMI 30-39.9) 08/16/2020   Social History   Socioeconomic History  . Marital status: Married    Spouse name: Not on file  . Number of children: 1  . Years of education: Not on file  . Highest education level: Not on file  Occupational History  . Occupation: customer service rep  Tobacco Use  . Smoking status: Never Smoker  . Smokeless tobacco: Never Used  Vaping Use  . Vaping Use: Never  used  Substance and Sexual Activity  . Alcohol use: Not Currently  . Drug use: Never  . Sexual activity: Yes  Other Topics Concern  . Not on file  Social History Narrative  . Not on file   Social Determinants of Health   Financial Resource Strain:   . Difficulty of Paying Living Expenses: Not on file  Food Insecurity:   . Worried About Programme researcher, broadcasting/film/video in the Last Year: Not on file  . Ran Out of Food in the Last Year: Not on file  Transportation Needs:   . Lack of Transportation (Medical): Not on file  . Lack of  Transportation (Non-Medical): Not on file  Physical Activity:   . Days of Exercise per Week: Not on file  . Minutes of Exercise per Session: Not on file  Stress:   . Feeling of Stress : Not on file  Social Connections:   . Frequency of Communication with Friends and Family: Not on file  . Frequency of Social Gatherings with Friends and Family: Not on file  . Attends Religious Services: Not on file  . Active Member of Clubs or Organizations: Not on file  . Attends Banker Meetings: Not on file  . Marital Status: Not on file  Intimate Partner Violence:   . Fear of Current or Ex-Partner: Not on file  . Emotionally Abused: Not on file  . Physically Abused: Not on file  . Sexually Abused: Not on file    Brittany Lamb's family history includes Arthritis in her brother, father, mother, sister, and son; Cancer in her mother and sister; Diabetes in her brother, mother, and sister; High Cholesterol in her brother, mother, and sister; High blood pressure in her brother, father, mother, and sister; Stroke in her father.      Objective:    Vitals:   10/09/20 1513  BP: 124/82  Pulse: 77  SpO2: 96%    Physical Exam Well-developed well-nourished W female in no acute distress.  Height, Weight, 231 BMI 37  HEENT; nontraumatic normocephalic, EOMI, PER R LA, sclera anicteric. Oropharynx; not examined Neck; supple, no JVD Cardiovascular; regular rate and rhythm with S1-S2, no murmur rub or gallop Pulmonary; Clear bilaterally Abdomen; soft, obese, mild tenderness in the epigastrium, nondistended, no palpable mass or hepatosplenomegaly, bowel sounds are active Rectal; not done today Skin; benign exam, no jaundice rash or appreciable lesions Extremities; no clubbing cyanosis or edema skin warm and dry Neuro/Psych; alert and oriented x4, grossly nonfocal mood and affect appropriate       Assessment & Plan:   #54 64 year old white female with long-term GERD with progression of  symptoms off medication over the past multiple months, and now with solid food dysphagia. Suspect chronic GERD, and peptic stricture, rule out motility disorder, rule out esophageal ring.  #2 mild constipation/abdominal bloating #3 colon cancer screening-prior colonoscopy done in [place] 6 to 7 years ago.  Negative per patient  #4 status post cholecystectomy  Plan; Start antireflux diet and strict antireflux regimen.  Discussed n.p.o. for 2 to 3 hours prior to bedtime and elevation of the back 45 degrees while sleeping Start Nexium 40 mg p.o. every morning AC breakfast  Patient will be scheduled for upper endoscopy with probable esophageal dilation with Dr. Myrtie Neither.  Procedure was discussed in detail with patient including indications risks and benefits and she is agreeable to proceed. She has not been vaccinated for COVID-19- will have preprocedure testing  Patient has signed a release and will  obtain copies of her prior EGD and colonoscopy, and then can determine interval timing for follow-up colonoscopy  Start MiraLAX 17 g in 8 ounces of water daily, and increase water intake on a daily basis.    Damesha Lawler S Tamana Hatfield PA-C 10/09/2020   Cc: Willow Ora, MD

## 2020-10-09 NOTE — Patient Instructions (Signed)
If you are age 62 or older, your body mass index should be between 23-30. Your Body mass index is 37.04 kg/m. If this is out of the aforementioned range listed, please consider follow up with your Primary Care Provider.  If you are age 75 or younger, your body mass index should be between 19-25. Your Body mass index is 37.04 kg/m. If this is out of the aformentioned range listed, please consider follow up with your Primary Care Provider.   You have been scheduled for an endoscopy. Please follow written instructions given to you at your visit today. If you use inhalers (even only as needed), please bring them with you on the day of your procedure.  START Nexium 40 mg 1 capsule before breakfast.  START Miralax 17 grams in 8 ounce of water or juice every day.  Follow up pending the results of your Endoscopy

## 2020-10-10 NOTE — Progress Notes (Signed)
____________________________________________________________  Attending physician addendum:  Thank you for sending this case to me. I have reviewed the entire note, and agree with the plan.    Syrah Daughtrey Danis, MD  ____________________________________________________________  

## 2020-10-24 ENCOUNTER — Other Ambulatory Visit: Payer: Self-pay | Admitting: Gastroenterology

## 2020-10-24 LAB — SARS CORONAVIRUS 2 (TAT 6-24 HRS): SARS Coronavirus 2: NEGATIVE

## 2020-10-26 ENCOUNTER — Ambulatory Visit (AMBULATORY_SURGERY_CENTER): Payer: Managed Care, Other (non HMO) | Admitting: Gastroenterology

## 2020-10-26 ENCOUNTER — Other Ambulatory Visit: Payer: Self-pay

## 2020-10-26 ENCOUNTER — Encounter: Payer: Self-pay | Admitting: Gastroenterology

## 2020-10-26 VITALS — BP 113/73 | HR 69 | Temp 97.1°F | Resp 20 | Ht 66.25 in | Wt 231.0 lb

## 2020-10-26 DIAGNOSIS — R1319 Other dysphagia: Secondary | ICD-10-CM

## 2020-10-26 DIAGNOSIS — K219 Gastro-esophageal reflux disease without esophagitis: Secondary | ICD-10-CM

## 2020-10-26 MED ORDER — SODIUM CHLORIDE 0.9 % IV SOLN: 500.0000 mL | Freq: Once | INTRAVENOUS | Status: DC

## 2020-10-26 NOTE — Progress Notes (Signed)
To PACU, VSS. Report to [email or web address] 

## 2020-10-26 NOTE — Progress Notes (Signed)
Pt's states no medical or surgical changes since previsit or office visit.  VS CW  

## 2020-10-26 NOTE — Patient Instructions (Signed)
YOU HAD AN ENDOSCOPIC PROCEDURE TODAY AT THE [facility]:   Refer to the procedure report that was given to you for any specific questions about what was found during the examination.  If the procedure report does not answer your questions, please call your gastroenterologist to clarify.  If you requested that your care partner not be given the details of your procedure findings, then the procedure report has been included in a sealed envelope for you to review at your convenience later.  YOU SHOULD EXPECT: Some feelings of bloating in the abdomen. Passage of more gas than usual.  Walking can help get rid of the air that was put into your GI tract during the procedure and reduce the bloating. If you had a lower endoscopy (such as a colonoscopy or flexible sigmoidoscopy) you may notice spotting of blood in your stool or on the toilet paper. If you underwent a bowel prep for your procedure, you may not have a normal bowel movement for a few days.  Please Note:  You might notice some irritation and congestion in your nose or some drainage.  This is from the oxygen used during your procedure.  There is no need for concern and it should clear up in a day or so.  SYMPTOMS TO REPORT IMMEDIATELY:    Following upper endoscopy (EGD)  Vomiting of blood or coffee ground material  New chest pain or pain under the shoulder blades  Painful or persistently difficult swallowing  New shortness of breath  Fever of 100F or higher  Black, tarry-looking stools  For urgent or emergent issues, a gastroenterologist can be reached at any hour by calling [phone]. Do not use MyChart messaging for urgent concerns.    DIET:  We do recommend a small meal at first, but then you may proceed to your regular diet.  Drink plenty of fluids but you should avoid alcoholic beverages for 24 hours.  MEDICATIONS: Continue present medications. Try the Nexium once daily for several weeks to see if there is any  improvement in heartburn or dysphagia.  FOLLOW UP: Perform a Barium Swallow test using barium in liquid form (upright and recombent) at an appointment to be scheduled. Also come in to see Dr. Myrtie Neither for an appointment in his office after this test. Dr. Irving Burton office nurse will call you to schedule this appointment.   ACTIVITY:  You should plan to take it easy for the rest of today and you should NOT DRIVE or use heavy machinery until tomorrow (because of the sedation medicines used during the test).    FOLLOW UP: Our staff will call the number listed on your records 48-72 hours following your procedure to check on you and address any questions or concerns that you may have regarding the information given to you following your procedure. If we do not reach you, we will leave a message.  We will attempt to reach you two times.  During this call, we will ask if you have developed any symptoms of COVID 19. If you develop any symptoms (ie: fever, flu-like symptoms, shortness of breath, cough etc.) before then, please call [phone].  If you test positive for Covid 19 in the 2 weeks post procedure, please call and report this information to [place].    If any biopsies were taken you will be contacted by phone or by letter within the next 1-3 weeks.  Please call us at [phone] if you have not heard about the biopsies in 3  weeks.   Thank you for allowing [place] to provide for your healthcare needs today.   SIGNATURES/CONFIDENTIALITY: You and/or your care partner have signed paperwork which will be entered into your electronic medical record.  These signatures attest to the fact that that the information above on your After Visit Summary has been reviewed and is understood.  Full responsibility of the confidentiality of this discharge information lies with you and/or your care-partner.

## 2020-10-26 NOTE — Op Note (Signed)
[facility] Patient Name: Brittany Lamb Procedure Date: 10/26/2020 1:46 PM MRN: [id number] Endoscopist: Sherilyn Cooter L. Myrtie Lamb , MD Age: 64 Referring MD:  Date of Birth: 20-May-1956 Gender: Female Account #: [id number] Procedure:                Upper GI endoscopy Indications:              Esophageal dysphagia, Esophageal reflux symptoms                            that recur despite appropriate therapy (including                            nocturnal symptoms)                           Years of symptoms, worsening, patient not on PPI -                            had side effects from pantoprazole years ago.                            Recently prescribed Nexium but has not yet taken it. Medicines:                Monitored Anesthesia Care Procedure:                Pre-Anesthesia Assessment:                           - Prior to the procedure, a History and Physical                            was performed, and patient medications and                            allergies were reviewed. The patient's tolerance of                            previous anesthesia was also reviewed. The risks                            and benefits of the procedure and the sedation                            options and risks were discussed with the patient.                            All questions were answered, and informed consent                            was obtained. Prior Anticoagulants: The patient has                            taken no previous anticoagulant or antiplatelet  agents. ASA Grade Assessment: II - A patient with                            mild systemic disease. After reviewing the risks                            and benefits, the patient was deemed in                            satisfactory condition to undergo the procedure.                           After obtaining informed consent, the endoscope was                            passed under direct vision.  Throughout the                            procedure, the patient's blood pressure, pulse, and                            oxygen saturations were monitored continuously. The                            Endoscope was introduced through the mouth, and                            advanced to the second part of duodenum. The upper                            GI endoscopy was accomplished without difficulty.                            The patient tolerated the procedure well. Scope In: Scope Out: Findings:                 The examined esophagus was normal.No esophagitis,                            stricture or resistance passin scope through the                            EGJ. Normal mucosa. After scope withdrawal,                            dilation was performed with a Maloney dilator with                            mild resistance at 52 Fr.                           The stomach was normal.                           The cardia and  gastric fundus were normal on                            retroflexion.                           The examined duodenum was normal. Complications:            No immediate complications. Estimated Blood Loss:     Estimated blood loss: none. Impression:               - Normal esophagus. Dilated.                           - Normal stomach.                           - Normal examined duodenum.                           - No specimens collected.                           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 Recommendation:           - Patient has a contact number available for                            emergencies. The signs and symptoms of potential                            delayed complications were discussed with the                            patient. Return to normal activities tomorrow.                            Written discharge instructions were provided to the                            patient.                           - Resume previous diet.                            - Continue present medications.                           - Perform a barium swallow using barium in liquid                            form (upright and recumbent) at appointment to be                            scheduled. Clinic appointment to follow                           - Try  the nexium once daily for several weeks to                            see if any improvement in heartburn or dysphagia. Brittany Pau L. Myrtie Neither, MD 10/26/2020 2:15:27 PM This report has been signed electronically.

## 2020-10-27 ENCOUNTER — Other Ambulatory Visit: Payer: Self-pay

## 2020-10-27 ENCOUNTER — Telehealth: Payer: Self-pay | Admitting: Gastroenterology

## 2020-10-27 DIAGNOSIS — K219 Gastro-esophageal reflux disease without esophagitis: Secondary | ICD-10-CM

## 2020-10-27 DIAGNOSIS — R1319 Other dysphagia: Secondary | ICD-10-CM

## 2020-10-27 NOTE — Telephone Encounter (Signed)
Patient has been scheduled for barium swallow study at [facility] on Friday, 11/10/2020 at 10:30 AM, arrival time is 10:15 AM. Patient is NPO 3 hour priors. Patient is aware of appt information and restrictions, advised patient that I will also send her this information via My Chart. Patient verbalized understanding of all information and had no other concerns at the end of the call.

## 2020-10-30 ENCOUNTER — Telehealth: Payer: Self-pay | Admitting: *Deleted

## 2020-10-30 ENCOUNTER — Telehealth: Payer: Self-pay

## 2020-10-30 NOTE — Telephone Encounter (Signed)
First post procedure follow up call, no answer 

## 2020-10-30 NOTE — Telephone Encounter (Signed)
No answer, left message

## 2020-11-10 ENCOUNTER — Other Ambulatory Visit: Payer: Self-pay

## 2020-11-10 ENCOUNTER — Ambulatory Visit (HOSPITAL_COMMUNITY)
Admission: RE | Admit: 2020-11-10 | Discharge: 2020-11-10 | Disposition: A | Discharge status: Home / Self Care | Payer: Managed Care, Other (non HMO) | Source: Ambulatory Visit | Attending: Gastroenterology | Admitting: Gastroenterology

## 2020-11-10 DIAGNOSIS — R1319 Other dysphagia: Secondary | ICD-10-CM | POA: Insufficient documentation

## 2020-11-10 DIAGNOSIS — K219 Gastro-esophageal reflux disease without esophagitis: Secondary | ICD-10-CM

## 2020-11-15 ENCOUNTER — Other Ambulatory Visit: Payer: Self-pay

## 2020-11-15 ENCOUNTER — Telehealth: Payer: Self-pay

## 2020-11-15 MED ORDER — ESOMEPRAZOLE MAGNESIUM 40 MG PO PACK
40.0000 mg | PACK | Freq: Every day | ORAL | 11 refills | Status: DC
Start: 2020-11-15 — End: 2022-02-11

## 2020-11-15 NOTE — Telephone Encounter (Signed)
Refill sent to [facility]

## 2020-11-15 NOTE — Telephone Encounter (Signed)
  LAST APPOINTMENT DATE: 08/16/2020   NEXT APPOINTMENT DATE:@3 /18/2022  MEDICATION:esomeprazole (NEXIUM) 40 MG packet  [facility]Address: [address] [place] [address]   Please advise

## 2020-12-15 ENCOUNTER — Other Ambulatory Visit: Payer: Self-pay | Admitting: Family Medicine

## 2020-12-15 DIAGNOSIS — Z1231 Encounter for screening mammogram for malignant neoplasm of breast: Secondary | ICD-10-CM

## 2020-12-19 ENCOUNTER — Encounter: Payer: Self-pay | Admitting: Family Medicine

## 2020-12-21 ENCOUNTER — Ambulatory Visit: Payer: Managed Care, Other (non HMO) | Admitting: Gastroenterology

## 2021-01-05 DIAGNOSIS — Z1231 Encounter for screening mammogram for malignant neoplasm of breast: Secondary | ICD-10-CM

## 2021-01-14 ENCOUNTER — Encounter: Payer: Self-pay | Admitting: Family Medicine

## 2021-02-01 ENCOUNTER — Ambulatory Visit: Payer: Managed Care, Other (non HMO) | Admitting: Gastroenterology

## 2021-02-07 ENCOUNTER — Encounter: Payer: Self-pay | Admitting: Family Medicine

## 2021-02-09 ENCOUNTER — Encounter: Payer: Self-pay | Admitting: Family Medicine

## 2021-02-16 ENCOUNTER — Other Ambulatory Visit: Payer: Self-pay

## 2021-02-16 ENCOUNTER — Ambulatory Visit
Admission: RE | Admit: 2021-02-16 | Discharge: 2021-02-16 | Disposition: A | Discharge status: Home / Self Care | Payer: Managed Care, Other (non HMO) | Source: Ambulatory Visit | Attending: Family Medicine | Admitting: Family Medicine

## 2021-02-16 DIAGNOSIS — Z1231 Encounter for screening mammogram for malignant neoplasm of breast: Secondary | ICD-10-CM

## 2021-03-02 ENCOUNTER — Encounter: Payer: Managed Care, Other (non HMO) | Admitting: Family Medicine

## 2021-03-21 ENCOUNTER — Ambulatory Visit: Payer: Managed Care, Other (non HMO) | Admitting: Gastroenterology

## 2021-04-02 ENCOUNTER — Ambulatory Visit: Payer: Managed Care, Other (non HMO) | Admitting: Family Medicine

## 2021-04-02 ENCOUNTER — Encounter: Payer: Self-pay | Admitting: Family Medicine

## 2021-04-02 ENCOUNTER — Other Ambulatory Visit: Payer: Self-pay

## 2021-04-02 VITALS — BP 120/78 | HR 87 | Temp 97.7°F | Wt 230.0 lb

## 2021-04-02 DIAGNOSIS — Z8616 Personal history of COVID-19: Secondary | ICD-10-CM | POA: Diagnosis not present

## 2021-04-02 DIAGNOSIS — Z23 Encounter for immunization: Secondary | ICD-10-CM

## 2021-04-02 DIAGNOSIS — L659 Nonscarring hair loss, unspecified: Secondary | ICD-10-CM

## 2021-04-02 DIAGNOSIS — E669 Obesity, unspecified: Secondary | ICD-10-CM

## 2021-04-02 DIAGNOSIS — L650 Telogen effluvium: Secondary | ICD-10-CM | POA: Diagnosis not present

## 2021-04-02 NOTE — Progress Notes (Signed)
Subjective  CC:  Chief Complaint  Patient presents with  . Alopecia    Has been having hair loss since having Covid the end of January.  . Weight Loss    Wants to discuss  . Immunizations    Shingrix    HPI: Brittany Lamb is a 65 y.o. female who presents to the office today to address the problems listed above in the chief complaint.  65 year old who reports a moderate COVID infection not requiring hospitalization back in January.  She is unvaccinated and still remains skeptical.  Fortunately she recovered, however she has persistent thinning of her hair.  No scalp rashes, lesions or itching.  Hair is not falling out patches.  Denies chronic fatigue, chills, fevers or other skin conditions.  Obesity: Would like to lose weight.  Has struggled with obesity for much of her adult life.  About 4 years ago she sought the help of a nutritionist in [place].  She worked for 4 months and low calorie diet but only lost about 5 pounds.  She is frustrated.  She is never used weight loss medications.  She admits that its been hard to lose since she is postmenopausal.  We discussed her diet in detail: She does not overeat.  Diet is mixed, she does miss meals.  Eats processed foods.  No exercise.  She likes veggies but does not prepare them frequently.  She eats meat with most dinners.  She has a sedentary job.  Health maintenance: She is scheduled for her physical in June.  She is eligible for Shingrix.  Wt Readings from Last 3 Encounters:  04/02/21 230 lb (104.3 kg)  10/26/20 231 lb (104.8 kg)  10/09/20 231 lb 3.2 oz (104.9 kg)   Lab Results  Component Value Date   TSH 1.36 02/02/2020    Assessment  1. Obesity (BMI 30-39.9)   2. Hair thinning   3. Telogen effluvium   4. History of COVID-19      Plan   Obesity: Had long discussion regarding weight loss, barriers to success, diet options and need for exercise.  Discussed potential medications including GLP-1 inhibitors.  We will check lab  work at her physical in June.  Patient told to hold biotin after June 1 so we can check her thyroid.  She will work on things we will recheck her weight at that time as well.  Then we can consider medications if indicated.  Also discussed healthy weight and wellness referral.  Hair thinning: Discussed telogen effluvium is likely diagnosis.  Time should improve this.  Reassured.  Likely related to her COVID infection back in January Today's visit was 34 minutes long. Greater than 50% of this time was devoted to face to face counseling with the patient and coordination of care. We discussed her diagnosis, prognosis, treatment options and treatment plan is documented above.   Follow up: for cpe   05/21/2021  Orders Placed This Encounter  Procedures  . Varicella-zoster vaccine IM (Shingrix)   No orders of the defined types were placed in this encounter.     I reviewed the patients updated PMH, FH, and SocHx.    Patient Active Problem List   Diagnosis Date Noted  . Abdominal bloating 08/16/2020  . Gastroesophageal reflux disease without esophagitis 08/16/2020  . Menopausal hot flushes 08/16/2020  . Obesity (BMI 30-39.9) 08/16/2020   Current Meds  Medication Sig  . Biotin 93570 MCG TABS Take by mouth daily.  . Multiple Vitamins-Minerals (MULTIPLE VITAMINS/WOMENS PO)  Take 1 tablet by mouth daily.   . Red Yeast Rice Extract (RED YEAST RICE PO) Take 2 tablets by mouth daily.    Current Facility-Administered Medications for the 04/02/21 encounter (Office Visit) with Willow Ora, MD  Medication  . 0.9 %  sodium chloride infusion    Allergies: Patient is allergic to penicillins. Family History: Patient family history includes Arthritis in her brother, father, mother, sister, and son; Cancer in her mother and sister; Diabetes in her brother, mother, and sister; High Cholesterol in her brother, mother, and sister; High blood pressure in her brother, father, mother, and sister; Stroke in  her father. Social History:  Patient  reports that she has never smoked. She has never used smokeless tobacco. She reports previous alcohol use. She reports that she does not use drugs.  Review of Systems: Constitutional: Negative for fever malaise or anorexia Cardiovascular: negative for chest pain Respiratory: negative for SOB or persistent cough Gastrointestinal: negative for abdominal pain  Objective  Vitals: BP 120/78   Pulse 87   Temp 97.7 F (36.5 C) (Temporal)   Wt 230 lb (104.3 kg)   SpO2 96%   BMI 36.84 kg/m  General: no acute distress , A&Ox3    Commons side effects, risks, benefits, and alternatives for medications and treatment plan prescribed today were discussed, and the patient expressed understanding of the given instructions. Patient is instructed to call or message via MyChart if he/she has any questions or concerns regarding our treatment plan. No barriers to understanding were identified. We discussed Red Flag symptoms and signs in detail. Patient expressed understanding regarding what to do in case of urgent or emergency type symptoms.   Medication list was reconciled, printed and provided to the patient in AVS. Patient instructions and summary information was reviewed with the patient as documented in the AVS. This note was prepared with assistance of Dragon voice recognition software. Occasional wrong-word or sound-a-like substitutions may have occurred due to the inherent limitations of voice recognition software  This visit occurred during the SARS-CoV-2 public health emergency.  Safety protocols were in place, including screening questions prior to the visit, additional usage of staff PPE, and extensive cleaning of exam room while observing appropriate contact time as indicated for disinfecting solutions.

## 2021-05-21 ENCOUNTER — Ambulatory Visit (INDEPENDENT_AMBULATORY_CARE_PROVIDER_SITE_OTHER): Payer: Managed Care, Other (non HMO) | Admitting: Family Medicine

## 2021-05-21 ENCOUNTER — Other Ambulatory Visit: Payer: Self-pay

## 2021-05-21 ENCOUNTER — Encounter: Payer: Self-pay | Admitting: Family Medicine

## 2021-05-21 VITALS — BP 125/75 | HR 67 | Temp 97.6°F | Ht 66.0 in

## 2021-05-21 DIAGNOSIS — E669 Obesity, unspecified: Secondary | ICD-10-CM

## 2021-05-21 DIAGNOSIS — K219 Gastro-esophageal reflux disease without esophagitis: Secondary | ICD-10-CM

## 2021-05-21 DIAGNOSIS — Z0000 Encounter for general adult medical examination without abnormal findings: Secondary | ICD-10-CM | POA: Diagnosis not present

## 2021-05-21 LAB — LIPID PANEL
Cholesterol: 238 mg/dL — ABNORMAL HIGH (ref 0–200)
HDL: 45.9 mg/dL (ref >39.00)
NonHDL: 191.82
Total CHOL/HDL Ratio: 5
Triglycerides: 211 mg/dL — ABNORMAL HIGH (ref 0.0–149.0)
VLDL: 42.2 mg/dL — ABNORMAL HIGH (ref 0.0–40.0)

## 2021-05-21 LAB — CBC WITH DIFFERENTIAL/PLATELET
Basophils Absolute: 0 10*3/uL (ref 0.0–0.1)
Basophils Relative: 0.6 % (ref 0.0–3.0)
Eosinophils Absolute: 0.1 10*3/uL (ref 0.0–0.7)
Eosinophils Relative: 1.5 % (ref 0.0–5.0)
HCT: 45 % (ref 36.0–46.0)
Hemoglobin: 15.3 g/dL — ABNORMAL HIGH (ref 12.0–15.0)
Lymphocytes Relative: 30.4 % (ref 12.0–46.0)
Lymphs Abs: 1.3 10*3/uL (ref 0.7–4.0)
MCHC: 34 g/dL (ref 30.0–36.0)
MCV: 95 fl (ref 78.0–100.0)
Monocytes Absolute: 0.4 10*3/uL (ref 0.1–1.0)
Monocytes Relative: 8.4 % (ref 3.0–12.0)
Neutro Abs: 2.5 10*3/uL (ref 1.4–7.7)
Neutrophils Relative %: 59.1 % (ref 43.0–77.0)
Platelets: 162 10*3/uL (ref 150.0–400.0)
RBC: 4.74 Mil/uL (ref 3.87–5.11)
RDW: 12.9 % (ref 11.5–15.5)
WBC: 4.3 10*3/uL (ref 4.0–10.5)

## 2021-05-21 LAB — COMPREHENSIVE METABOLIC PANEL
ALT: 29 U/L (ref 0–35)
AST: 25 U/L (ref 0–37)
Albumin: 4.4 g/dL (ref 3.5–5.2)
Alkaline Phosphatase: 56 U/L (ref 39–117)
BUN: 15 mg/dL (ref 6–23)
CO2: 30 mEq/L (ref 19–32)
Calcium: 9.8 mg/dL (ref 8.4–10.5)
Chloride: 100 mEq/L (ref 96–112)
Creatinine, Ser: 1.09 mg/dL (ref 0.40–1.20)
GFR: 54.13 mL/min — ABNORMAL LOW (ref >60.00)
Glucose, Bld: 97 mg/dL (ref 70–99)
Potassium: 3.9 mEq/L (ref 3.5–5.1)
Sodium: 139 mEq/L (ref 135–145)
Total Bilirubin: 0.6 mg/dL (ref 0.2–1.2)
Total Protein: 6.5 g/dL (ref 6.0–8.3)

## 2021-05-21 LAB — VITAMIN D 25 HYDROXY (VIT D DEFICIENCY, FRACTURES): VITD: 32.54 ng/mL (ref 30.00–100.00)

## 2021-05-21 LAB — HEMOGLOBIN A1C: Hgb A1c MFr Bld: 6.3 % (ref 4.6–6.5)

## 2021-05-21 LAB — LDL CHOLESTEROL, DIRECT: Direct LDL: 173 mg/dL

## 2021-05-21 NOTE — Progress Notes (Signed)
Subjective  Chief Complaint  Patient presents with   Annual Exam    Non-fasting, did not stop Biotin on June 1st as previously intructed   Health Maintenance    Needing referral for colonoscopy    Gastroesophageal Reflux    Requesting to have Maalox with lidocaine script, has stopped taking nexium     HPI: Brittany Lamb is a 65 y.o. female who presents to [facility] [place] today for a Female Wellness Visit. She also has the concerns and/or needs as listed above in the chief complaint. These will be addressed in addition to the Health Maintenance Visit.   Wellness Visit: annual visit with health maintenance review and exam without Pap  HM; up todate on colonoscopy; nl in 2018. No pap due to hysterectomy. 2nd shingrix due soon. Wants to work on diet and exercise. Hasn't yet started. Discussed nutrition Chronic disease f/u and/or acute problem visit: (deemed necessary to be done in addition to the wellness visit): GERD: did well on nexium. Needs refill. Had EGD.   Assessment  1. Annual physical exam   2. Obesity (BMI 30-39.9)   3. Gastroesophageal reflux disease without esophagitis      Plan  Female Wellness Visit: Age appropriate Health Maintenance and Prevention measures were discussed with patient. Included topics are cancer screening recommendations, ways to keep healthy (see AVS) including dietary and exercise recommendations, regular eye and dental care, use of seat belts, and avoidance of moderate alcohol use and tobacco use.  BMI: discussed patient's BMI and encouraged positive lifestyle modifications to help get to or maintain a target BMI. HM needs and immunizations were addressed and ordered. See below for orders. See HM and immunization section for updates. Routine labs and screening tests ordered including cmp, cbc and lipids where appropriate. Discussed recommendations regarding Vit D and calcium supplementation (see AVS)  Chronic disease  management visit and/or acute problem visit: GERD: refilled nexium. Has f/u with GI to go over recnet egd findings and treatment options.  Obesity: check tsh and a1c. Made recs on dietary changes and exercise for weight loss.  Will return for shingrix and tsh off biotin.   Follow up: Return in about 1 year (around 05/21/2022) for complete physical.  Orders Placed This Encounter  Procedures   CBC with Differential/Platelet   Comprehensive metabolic panel   Lipid panel   Hemoglobin A1c   VITAMIN D 25 Hydroxy (Vit-D Deficiency, Fractures)   TSH   LDL cholesterol, direct   No orders of the defined types were placed in this encounter.     Body mass index is 37.12 kg/m. Wt Readings from Last 3 Encounters:  04/02/21 230 lb (104.3 kg)  10/26/20 231 lb (104.8 kg)  10/09/20 231 lb 3.2 oz (104.9 kg)     Patient Active Problem List   Diagnosis Date Noted   Abdominal bloating 08/16/2020   Gastroesophageal reflux disease without esophagitis 08/16/2020   Menopausal hot flushes 08/16/2020   Obesity (BMI 30-39.9) 08/16/2020   Health Maintenance  Topic Date Due   Zoster Vaccines- Shingrix (2 of 2) 05/28/2021   INFLUENZA VACCINE  06/25/2021   MAMMOGRAM  02/16/2022   COLONOSCOPY (Pts 45-47yrs Insurance coverage will need to be confirmed)  11/25/2026   TETANUS/TDAP  02/01/2030   Hepatitis C Screening  Completed   HIV Screening  Completed   Pneumococcal Vaccine 91-7 Years old  Aged Out   HPV VACCINES  Aged Out   COVID-19 Vaccine  Discontinued   Immunization  History  Administered Date(s) Administered   Tdap 02/02/2020   Zoster Recombinat (Shingrix) 04/02/2021   We updated and reviewed the patient's past history in detail and it is documented below. Allergies: Patient is allergic to penicillins. Past Medical History Patient  has a past medical history of Chicken pox and GERD (gastroesophageal reflux disease). Past Surgical History Patient  has a past surgical history that includes  Abdominal hysterectomy; Replacement total knee (Left); Gallbladder surgery; and Tonsillectomy. Family History: Patient family history includes Arthritis in her brother, father, mother, sister, and son; Cancer in her mother and sister; Diabetes in her brother, mother, and sister; High Cholesterol in her brother, mother, and sister; High blood pressure in her brother, father, mother, and sister; Stroke in her father. Social History:  Patient  reports that she has never smoked. She has never used smokeless tobacco. She reports previous alcohol use. She reports that she does not use drugs.  Review of Systems: Constitutional: negative for fever or malaise Ophthalmic: negative for photophobia, double vision or loss of vision Cardiovascular: negative for chest pain, dyspnea on exertion, or new LE swelling Respiratory: negative for SOB or persistent cough Gastrointestinal: negative for abdominal pain, change in bowel habits or melena Genitourinary: negative for dysuria or gross hematuria, no abnormal uterine bleeding or disharge Musculoskeletal: negative for new gait disturbance or muscular weakness Integumentary: negative for new or persistent rashes, no breast lumps Neurological: negative for TIA or stroke symptoms Psychiatric: negative for SI or delusions Allergic/Immunologic: negative for hives  Patient Care Team    Relationship Specialty Notifications Start End  Willow Ora, MD PCP - General Family Medicine  02/02/20   Sherrilyn Rist, MD Consulting Physician Gastroenterology  05/21/21     Objective  Vitals: BP 125/75   Pulse 67   Temp 97.6 F (36.4 C) (Temporal)   Ht 5\' 6"  (1.676 m)   SpO2 95%   BMI 37.12 kg/m  General:  Well developed, well nourished, no acute distress  Psych:  Alert and orientedx3,normal mood and affect HEENT:  Normocephalic, atraumatic, non-icteric sclera,  supple neck without adenopathy, mass or thyromegaly Cardiovascular:  Normal S1, S2, RRR without  gallop, rub or murmur Respiratory:  Good breath sounds bilaterally, CTAB with normal respiratory effort Gastrointestinal: normal bowel sounds, soft, non-tender, no noted masses. No HSM MSK: no deformities, contusions. Joints are without erythema or swelling.  Skin:  Warm, no rashes or suspicious lesions noted Neurologic:    Mental status is normal. CN 2-11 are normal. Gross motor and sensory exams are normal. Normal gait. No tremor   Commons side effects, risks, benefits, and alternatives for medications and treatment plan prescribed today were discussed, and the patient expressed understanding of the given instructions. Patient is instructed to call or message via MyChart if he/she has any questions or concerns regarding our treatment plan. No barriers to understanding were identified. We discussed Red Flag symptoms and signs in detail. Patient expressed understanding regarding what to do in case of urgent or emergency type symptoms.  Medication list was reconciled, printed and provided to the patient in AVS. Patient instructions and summary information was reviewed with the patient as documented in the AVS. This note was prepared with assistance of Dragon voice recognition software. Occasional wrong-word or sound-a-like substitutions may have occurred due to the inherent limitations of voice recognition software  This visit occurred during the SARS-CoV-2 public health emergency.  Safety protocols were in place, including screening questions prior to the visit, additional usage of  staff PPE, and extensive cleaning of exam room while observing appropriate contact time as indicated for disinfecting solutions.

## 2021-05-21 NOTE — Patient Instructions (Addendum)
Please return in 6 weeks for a nurse and lab visit to get your 2nd Shingrix vaccine and check your thyroid.   STOP taking biotin now!  I will release your lab results to you on your MyChart account with further instructions. Please reply with any questions.    Call [facility] to refill your Nexium; refills are left on the bottle.   If you have any questions or concerns, please don't hesitate to send me a message via MyChart or call the office at [phone]. Thank you for visiting with [place] today! It's our pleasure caring for you.

## 2021-05-29 ENCOUNTER — Other Ambulatory Visit: Payer: Self-pay

## 2021-05-29 MED ORDER — ROSUVASTATIN CALCIUM 10 MG PO TABS: 10.0000 mg | ORAL_TABLET | Freq: Every day | ORAL | 3 refills | Status: DC

## 2021-06-06 ENCOUNTER — Other Ambulatory Visit: Payer: Self-pay

## 2021-06-06 ENCOUNTER — Ambulatory Visit (INDEPENDENT_AMBULATORY_CARE_PROVIDER_SITE_OTHER): Payer: Managed Care, Other (non HMO) | Admitting: *Deleted

## 2021-06-06 ENCOUNTER — Other Ambulatory Visit (INDEPENDENT_AMBULATORY_CARE_PROVIDER_SITE_OTHER): Payer: Managed Care, Other (non HMO)

## 2021-06-06 DIAGNOSIS — Z23 Encounter for immunization: Secondary | ICD-10-CM

## 2021-06-06 DIAGNOSIS — Z0000 Encounter for general adult medical examination without abnormal findings: Secondary | ICD-10-CM

## 2021-06-06 NOTE — Progress Notes (Signed)
Patient present for #2 shingles vaccine  Vaccine give IM lt deltoid  Patient tolerated well

## 2021-06-07 LAB — TSH: TSH: 1.39 u[IU]/mL (ref 0.35–5.50)

## 2021-08-07 ENCOUNTER — Ambulatory Visit: Payer: Managed Care, Other (non HMO)

## 2022-01-03 ENCOUNTER — Telehealth: Payer: Self-pay | Admitting: Family Medicine

## 2022-01-03 NOTE — Telephone Encounter (Signed)
Pt needs to schedule a physical with Dr Mardelle Matte but would like to have her labs scheduled a week earlier. She is wanting the results back before her physical to discuss. Please advise

## 2022-01-04 NOTE — Telephone Encounter (Signed)
Spoke with pt, Pt states she will schedule her appointment for her physical earlier than June due to her needing it for her job.

## 2022-02-11 ENCOUNTER — Encounter: Payer: Self-pay | Admitting: Family Medicine

## 2022-02-11 ENCOUNTER — Ambulatory Visit: Payer: Managed Care, Other (non HMO) | Admitting: Family Medicine

## 2022-02-11 VITALS — BP 120/76 | HR 79 | Temp 98.2°F | Ht 66.0 in | Wt 231.2 lb

## 2022-02-11 DIAGNOSIS — N951 Menopausal and female climacteric states: Secondary | ICD-10-CM

## 2022-02-11 DIAGNOSIS — M25511 Pain in right shoulder: Secondary | ICD-10-CM | POA: Diagnosis not present

## 2022-02-11 DIAGNOSIS — R252 Cramp and spasm: Secondary | ICD-10-CM

## 2022-02-11 DIAGNOSIS — R7303 Prediabetes: Secondary | ICD-10-CM | POA: Diagnosis not present

## 2022-02-11 DIAGNOSIS — E782 Mixed hyperlipidemia: Secondary | ICD-10-CM

## 2022-02-11 DIAGNOSIS — M25512 Pain in left shoulder: Secondary | ICD-10-CM | POA: Diagnosis not present

## 2022-02-11 LAB — POCT GLYCOSYLATED HEMOGLOBIN (HGB A1C): HbA1c, POC (prediabetic range): 6 % (ref 5.7–6.4)

## 2022-02-11 NOTE — Patient Instructions (Addendum)
Please return in 3-4 weeks to recheck shoulders and discuss hot flushes. ?You can try otc black cohosh twice daily for your hot flushes.  ? ? ?If you have any questions or concerns, please don't hesitate to send me a message via MyChart or call the office at [phone]. Thank you for visiting with [place] today! It's our pleasure caring for you.  ? ?You had a steroid injection today.   ?Things to be aware of after this injection are listed below: ?You may experience no significant improvement or even a slight worsening in your symptoms during the first 24 to 48 hours.  After that we expect your symptoms to improve gradually over the next 2 weeks for the medicine to have its maximal effect.  You should continue to have improvement out to 6 weeks after your injection. ?I recommend icing the site of the injection for 20 minutes  1-2 times the day of your injection ?You may shower but no swimming, tub bath or Jacuzzi for 24 hours. ?If your bandage falls off this does not need to be replaced.  It is appropriate to remove the bandage after 4 hours. ?You may resume light activities as tolerated. ?  ?  ?POSSIBLE PROCEDURE SIDE EFFECTS: The side effects of the injection are usually fairly minimal however if you may experience some of the following side effects that are usually self-limited and will is off on their own.  If you are concerned please feel free to call the office with questions: ?            Increased numbness or tingling ?            Nausea or vomiting ?            Swelling or bruising at the injection site ?  ? ?Please call our office if if you experience any of the following symptoms over the next week as these can be signs of infection:  ?            Fever greater than 100.5?F ?            Significant swelling at the injection site ?            Significant redness or drainage from the injection site ?  ?Menopause and Hormone Replacement Therapy ?Menopause is a normal time of life when menstrual periods stop  completely and the ovaries stop producing the female hormones estrogen and progesterone. Low levels of these hormones can affect your health and cause symptoms. Hormone replacement therapy (HRT) can relieve some of those symptoms. ?HRT is the use of artificial (synthetic) hormones to replace hormones that your body has stopped producing because you have reached menopause. ?Types of HRT ?HRT may consist of the synthetic hormones estrogen and progestin, or it may consist of estrogen-only therapy. You and your health care provider will decide which form of HRT is best for you. ?If you choose to be on HRT and you have a uterus, estrogen and progestin are usually prescribed. Estrogen-only therapy is used for women who do not have a uterus. ?Possible options for taking HRT include: ?Pills. ?Patches. ?Gels. ?Sprays. ?Vaginal cream. ?Vaginal rings. ?Vaginal inserts. ?The amount of hormones that you take and how long you take them varies according to your health. It is important to: ?Begin HRT with the lowest possible dosage. ?Stop HRT as soon as your health care provider tells you to stop. ?Work with your health care provider so that you feel informed  and comfortable with your decisions. ?Tell a health care provider about: ?Any allergies you have. ?Whether you have had blood clots or know of any risk factors you may have for blood clots. ?Whether you or family members have had cancer, especially cancer of the breasts, ovaries, or uterus. ?Any surgeries you have had. ?All medicines you are taking, including vitamins, herbs, eye drops, creams, and over-the-counter medicines. ?Whether you are pregnant or may be pregnant. ?Any medical conditions you have. ?What are the benefits? ?HRT can reduce the frequency and severity of menopausal symptoms. Benefits of HRT vary according to the kind of symptoms that you have, how severe they are, and your overall health. HRT may help to improve the following symptoms of menopause: ?Hot  flashes and night sweats. These are sudden feelings of heat that spread over the face and body. The skin may turn red, like a blush. Night sweats are hot flashes that happen while you are sleeping or trying to sleep. ?Bone loss (osteoporosis). The body loses calcium more quickly after menopause, causing the bones to become weaker. This can increase the risk for bone breaks (fractures). ?Vaginal dryness. The lining of the vagina can become thin and dry, which can cause pain during sex or cause infection, burning, or itching. ?Urinary tract infections. ?Urinary incontinence. This is the inability to control when you urinate. ?Irritability. ?Short-term memory problems. ?What are the risks? ?Risks of HRT vary depending on your individual health and medical history. Risks of HRT also depend on whether you receive both estrogen and progestin or you receive estrogen only. HRT may increase the risk of: ?Spotting. This is when a small amount of blood leaks from the vagina unexpectedly. ?Endometrial cancer. This cancer is in the lining of the uterus (endometrium). ?Breast cancer. ?Increased density of breast tissue. This can make it harder to find breast cancer on a breast X-ray (mammogram). ?Stroke. ?Heart disease. ?Blood clots. ?Gallbladder disease or liver disease. ?Risks of HRT can increase if you have any of the following conditions: ?Endometrial cancer. ?Liver disease. ?Heart disease. ?Breast cancer. ?History of blood clots. ?History of stroke. ?Follow these instructions at home: ?Pap tests ?Have Pap tests done as often as told by your health care provider. A Pap test is sometimes called a Pap smear. It is a screening test that is used to check for signs of cancer of the cervix and vagina. A Pap test can also identify the presence of infection or precancerous changes. Pap tests may be done: ?Every 3 years, starting at age 64. ?Every 5 years, starting after age 64, in combination with testing for human papillomavirus  (HPV). ?More often or less often depending on other medical conditions you have, your age, and other risk factors. ?It is up to you to get the results of your Pap test. Ask your health care provider, or the department that is doing the test, when your results will be ready. ?General instructions ?Take over-the-counter and prescription medicines only as told by your health care provider. ?Do not use any products that contain nicotine or tobacco. These products include cigarettes, chewing tobacco, and vaping devices, such as e-cigarettes. If you need help quitting, ask your health care provider. ?Get mammograms, pelvic exams, and medical checkups as often as told by your health care provider. ?Keep all follow-up visits. This is important. ?Contact a health care provider if you have: ?Pain or swelling in your legs. ?Lumps or changes in your breasts or armpits. ?Pain, burning, or bleeding when you urinate. ?Unusual  vaginal bleeding. ?Dizziness or headaches. ?Pain in your abdomen. ?Get help right away if you have: ?Shortness of breath. ?Chest pain. ?Slurred speech. ?Weakness or numbness in any part of your arms or legs. ?These symptoms may represent a serious problem that is an emergency. Do not wait to see if the symptoms will go away. Get medical help right away. Call your local emergency services (911 in the [place] Do not drive yourself to the hospital. ?Summary ?Menopause is a normal time of life when menstrual periods stop completely and the ovaries stop producing the female hormones estrogen and progesterone. ?HRT can reduce the frequency and severity of menopausal symptoms. ?Risks of HRT vary depending on your individual health and medical history. ?This information is not intended to replace advice given to you by your health care provider. Make sure you discuss any questions you have with your health care provider. ?Document Revised: 05/15/2020 Document Reviewed: 05/15/2020 ?Elsevier Patient Education ? 2022  [employer]vier [employer] ? ?Diabetes Mellitus and Nutrition, Adult ?When you have diabetes, or diabetes mellitus, it is very important to have healthy eating habits because your blood sugar (glucose) levels are greatly affec

## 2022-02-11 NOTE — Progress Notes (Addendum)
? ?Subjective  ?CC:  ?Chief Complaint  ?Patient presents with  ? Leg cramps  ? Shoulder Pain  ?  Bilateral; More in the left. Starting 6 months ago.   ? Hot Flashes  ?  She would like to discuss options.  ? ? ?HPI: Brittany Lamb is a 66 y.o. female who presents to the office today to address the problems listed above in the chief complaint. ?Left greater than right shoulder pain.  Reports 6 months of left shoulder pain.  Progressive in nature.  Now pain most of the time.  Worse with lifting arm above head.  Also pain with lying on that side.  No weakness or neck pain.  No radicular symptoms.  No trauma.  She is very active.  Was mulching her yard last weekend.  Tends to just work through the pain.  Tylenol and Advil have been taking but rarely and intermittently.  800 mg of Advil did not relieve the pain.  She also now has noticed some similar symptoms on the right. ?Complains of persistent menopausal hot flushes.  Went through menopause about 13 years ago but still suffers at night mainly.  She does not wish to try prescription for HRT medications.  She is asking about alternative therapies. ?Complains of muscle cramps or trauma courses at night in the legs.  Ongoing for years. ?Hyperlipidemia: Did not start the Crestor. ?Prediabetes: Had A1c of 6.3 back in July of last year.  Did not schedule follow-up.  Denies symptoms of hypoglycemia but reports an unhealthy diet overall.  Would be interested in losing weight and eating healthier.  She denies symptoms of hyperglycemia ? ?Assessment  ?1. Bilateral shoulder pain, unspecified chronicity   ?2. Prediabetes   ?3. Menopausal hot flushes   ?4. Muscle cramp, nocturnal   ?5. Mixed hyperlipidemia   ? ?  ?Plan  ?Bilateral shoulder pain left greater than right: Suspect rotator cuff tendinopathy.  Could also have glenohumeral joint arthritis.  Check x-rays.  Gave her a steroid injection on the left today to see if that would be helpful.  Recommend icing, she has good  range of motion.  Recheck in 3 to 4 weeks. ?Prediabetes: Recheck A1c today.  Discussed diabetic diet and weight loss ?Hot flashes: Discussed alternative therapies.  To try black cohosh to start ?Muscle cramps: Good hydration, muscle stretching, can try tonic water intermittently. ?Hyperlipidemia: Patient is hesitant to start Crestor.  Can discuss at her upcoming physical ? ?I spent a total of 51 minutes for this patient encounter. In addition to time spent performing shoulder injection I spent 43 minutes with the patient:  time spent included preparation, face-to-face counseling with the patient and coordination of care, review of chart and records, and documentation of the encounter. ? ?Follow up: 3 to 4 weeks to recheck shoulder pain and discuss hot flashes ?05/24/2022 ? ?Orders Placed This Encounter  ?Procedures  ? DG Shoulder Left  ? DG Shoulder Right  ? POCT HgB A1C  ? ?No orders of the defined types were placed in this encounter. ? ?  ? ?I reviewed the patients updated PMH, FH, and SocHx.  ?  ?Patient Active Problem List  ? Diagnosis Date Noted  ? Prediabetes 02/11/2022  ? Abdominal bloating 08/16/2020  ? Gastroesophageal reflux disease without esophagitis 08/16/2020  ? Menopausal hot flushes 08/16/2020  ? Obesity (BMI 30-39.9) 08/16/2020  ? ?Current Meds  ?Medication Sig  ? Biotin 16967 MCG TABS Take by mouth daily.  ? Cholecalciferol (VITAMIN D3)  25 MCG (1000 UT) CAPS Take 2 capsules by mouth daily.  ? Multiple Vitamins-Minerals (MULTIPLE VITAMINS/WOMENS PO) Take 1 tablet by mouth daily.   ? Red Yeast Rice Extract (RED YEAST RICE PO) Take 2 tablets by mouth daily.   ? rosuvastatin (CRESTOR) 10 MG tablet Take 1 tablet (10 mg total) by mouth daily.  ? ? ?Allergies: ?Patient is allergic to penicillins. ?Family History: ?Patient family history includes Arthritis in her brother, father, mother, sister, and son; Cancer in her mother and sister; Diabetes in her brother, mother, and sister; High Cholesterol in her  brother, mother, and sister; High blood pressure in her brother, father, mother, and sister; Stroke in her father. ?Social History:  ?Patient  reports that she has never smoked. She has never used smokeless tobacco. She reports that she does not currently use alcohol. She reports that she does not use drugs. ? ?Review of Systems: ?Constitutional: Negative for fever malaise or anorexia ?Cardiovascular: negative for chest pain ?Respiratory: negative for SOB or persistent cough ?Gastrointestinal: negative for abdominal pain ? ?Objective  ?Vitals: BP 120/76   Pulse 79   Temp 98.2 ?F (36.8 ?C) (Temporal)   Ht 5\' 6"  (1.676 m)   Wt 231 lb 3.2 oz (104.9 kg)   SpO2 96%   BMI 37.32 kg/m?  ?General: no acute distress , A&Ox3 ?HEENT: PEERL, conjunctiva normal, neck is supple ?Cardiovascular:  RRR without murmur or gallop.  ?Respiratory:  Good breath sounds bilaterally, CTAB with normal respiratory effort ?Skin:  Warm, no rashes ?Right shoulder with mild anterior tenderness, good range of motion, mild pain at extreme abduction ?Left shoulder: Subacromial tenderness present.  Full range of motion but pain with abduction greater than 90 degrees.  Positive empty can testing.  Negative drop testing.  Decreased internal rotation and abduction. ? ?Shoulder Injection Procedure Note ? ?Pre-operative Diagnosis: left shoulder rotator cuff tendinopathy ? ?Post-operative Diagnosis: same ? ?Indications: Symptomatic relief and failed conservative measures ? ?Anesthesia: Lidocaine 1% without epinephrine without added sodium bicarbonate ? ?Procedure Details  ? ?Verbal consent was obtained for the procedure. The shoulder was prepped with alcohol  and the skin was anesthetized with cold spray. Using a 22 gauge needle the subacromial space was injected with 4 mL 1% lidocaine and 1 mL of triamcinolone (KENALOG) 40mg /ml under the posterior aspect of the acromion. The injection site was cleansed with topical isopropyl alcohol and a dressing was  applied. ? ?Complications:  None; patient tolerated the procedure well. ? ? ? ? ?Commons side effects, risks, benefits, and alternatives for medications and treatment plan prescribed today were discussed, and the patient expressed understanding of the given instructions. Patient is instructed to call or message via MyChart if he/she has any questions or concerns regarding our treatment plan. No barriers to understanding were identified. We discussed Red Flag symptoms and signs in detail. Patient expressed understanding regarding what to do in case of urgent or emergency type symptoms.  ?Medication list was reconciled, printed and provided to the patient in AVS. Patient instructions and summary information was reviewed with the patient as documented in the AVS. ?This note was prepared with assistance of . Occasional wrong-word or sound-a-like substitutions may have occurred due to the inherent limitations of voice recognition software ? ?This visit occurred during the SARS-CoV-2 public health emergency.  Safety protocols were in place, including screening questions prior to the visit, additional usage of staff PPE, and extensive cleaning of exam room while observing appropriate contact time as indicated  for disinfecting solutions.  ? ?

## 2022-02-22 ENCOUNTER — Ambulatory Visit (INDEPENDENT_AMBULATORY_CARE_PROVIDER_SITE_OTHER)
Admission: RE | Admit: 2022-02-22 | Discharge: 2022-02-22 | Disposition: A | Discharge status: Home / Self Care | Payer: Managed Care, Other (non HMO) | Source: Ambulatory Visit | Attending: Family Medicine | Admitting: Family Medicine

## 2022-02-22 DIAGNOSIS — M25511 Pain in right shoulder: Secondary | ICD-10-CM

## 2022-02-22 DIAGNOSIS — M25512 Pain in left shoulder: Secondary | ICD-10-CM | POA: Diagnosis not present

## 2022-02-25 ENCOUNTER — Telehealth: Payer: Self-pay

## 2022-02-25 ENCOUNTER — Encounter: Payer: Self-pay | Admitting: Family Medicine

## 2022-02-25 ENCOUNTER — Telehealth: Payer: Self-pay | Admitting: Family Medicine

## 2022-02-25 ENCOUNTER — Other Ambulatory Visit: Payer: Self-pay

## 2022-02-25 NOTE — Telephone Encounter (Signed)
[facility] calling with a STAT. Please call back at [phone]. ?

## 2022-02-25 NOTE — Telephone Encounter (Signed)
Opened in error

## 2022-02-25 NOTE — Telephone Encounter (Signed)
Returned call to [employer] for [employer] and spoke with Erskine Squibb. ? ?Rt shoulder Xray: ? 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      ?

## 2022-02-25 NOTE — Telephone Encounter (Signed)
Lvm for the pt to call the office back. 

## 2022-02-26 ENCOUNTER — Other Ambulatory Visit: Payer: Self-pay

## 2022-02-26 DIAGNOSIS — M25511 Pain in right shoulder: Secondary | ICD-10-CM

## 2022-02-26 NOTE — Progress Notes (Signed)
Please call patient: Please see her mychart note asking about this: please order cxr as recommended in the telephone encounter and send her a note to clarify.  ?thanks

## 2022-02-26 NOTE — Telephone Encounter (Signed)
New Xray placed. Pt was sent a My Chart message to make aware.  ?

## 2022-02-28 DIAGNOSIS — M25511 Pain in right shoulder: Secondary | ICD-10-CM | POA: Insufficient documentation

## 2022-02-28 MED ORDER — TRIAMCINOLONE ACETONIDE 40 MG/ML IJ SUSP
40.0000 mg | Freq: Once | INTRAMUSCULAR | Status: AC
  Administered 2022-02-11: 40 mg via INTRAMUSCULAR

## 2022-02-28 NOTE — Addendum Note (Signed)
Addended by: Jerrel Ivory D on: 02/28/2022 12:21 PM ? ? Modules accepted: Orders ? ?

## 2022-03-06 ENCOUNTER — Ambulatory Visit (INDEPENDENT_AMBULATORY_CARE_PROVIDER_SITE_OTHER)
Admission: RE | Admit: 2022-03-06 | Discharge: 2022-03-06 | Disposition: A | Discharge status: Home / Self Care | Payer: Managed Care, Other (non HMO) | Source: Ambulatory Visit | Attending: Family Medicine | Admitting: Family Medicine

## 2022-03-06 DIAGNOSIS — M25512 Pain in left shoulder: Secondary | ICD-10-CM | POA: Diagnosis not present

## 2022-03-06 DIAGNOSIS — M25511 Pain in right shoulder: Secondary | ICD-10-CM

## 2022-03-11 ENCOUNTER — Ambulatory Visit: Payer: Managed Care, Other (non HMO) | Admitting: Family Medicine

## 2022-03-11 ENCOUNTER — Encounter: Payer: Self-pay | Admitting: Family Medicine

## 2022-03-11 VITALS — BP 118/80 | HR 68 | Temp 98.0°F | Ht 66.0 in | Wt 230.4 lb

## 2022-03-11 DIAGNOSIS — M19011 Primary osteoarthritis, right shoulder: Secondary | ICD-10-CM

## 2022-03-11 DIAGNOSIS — M19012 Primary osteoarthritis, left shoulder: Secondary | ICD-10-CM

## 2022-03-11 DIAGNOSIS — E782 Mixed hyperlipidemia: Secondary | ICD-10-CM | POA: Diagnosis not present

## 2022-03-11 DIAGNOSIS — R911 Solitary pulmonary nodule: Secondary | ICD-10-CM | POA: Insufficient documentation

## 2022-03-11 DIAGNOSIS — N951 Menopausal and female climacteric states: Secondary | ICD-10-CM | POA: Diagnosis not present

## 2022-03-11 DIAGNOSIS — R918 Other nonspecific abnormal finding of lung field: Secondary | ICD-10-CM | POA: Insufficient documentation

## 2022-03-11 MED ORDER — BLACK COHOSH 40 MG PO CAPS
ORAL_CAPSULE | ORAL | 0 refills | Status: DC
Start: 2022-03-11 — End: 2022-09-16

## 2022-03-11 MED ORDER — MELOXICAM 7.5 MG PO TABS
7.5000 mg | ORAL_TABLET | Freq: Every day | ORAL | 2 refills | Status: DC | PRN
Start: 2022-03-11 — End: 2022-09-09

## 2022-03-11 NOTE — Progress Notes (Signed)
? ?Subjective  ?CC:  ?Chief Complaint  ?Patient presents with  ? Shoulder Pain  ?  Pt her to F/U with shoulder pain and still having the hot flashes on/off. She is not currently taken the crestor.  ? ? ?HPI: Brittany Lamb is a 66 y.o. female who presents to the office today to address the problems listed above in the chief complaint. ?Bilateral shoulder pain: Reviewed last note.  Treated with steroid injection on the left.  That did help relieve the pain but only lasted 1 week.  X-rays showed bilateral osteoarthritis in the glenohumeral and AC joints.  Also some tendinopathy changes on the right.  She admits pain has been ongoing for years but worse over the last 6 months.  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  Pain is limiting.  No radicular symptoms.  No neck pain. ?Chest x-ray to follow-up on possible nodule that was seen on her x-ray of her shoulder showed a probably benign nodule in the right lung.  Non-smoker.  No pulmonary symptoms.  Chest CT was recommended to further evaluate and ensure benign characteristics. ?Menopausal hot flushes, 13 years postmenopausal.  Black cohosh twice daily has helped significantly.  Sleeping through the night more.  No adverse effects. ?History of hyperlipidemia: Never started Crestor.  See values below. ? ?The 10-year ASCVD risk score (Arnett DK, et al., 2019) is: 5.2% ?  Values used to calculate the score: ?    Age: 41 years ?    Sex: Female ?    Is Non-Hispanic African American: No ?    Diabetic: No ?    Tobacco smoker: No ?    Systolic Blood Pressure: 123456 mmHg ?    Is BP treated: No ?    HDL Cholesterol: 45.9 mg/dL ?    Total Cholesterol: 238 mg/dL ?Lab Results  ?Component Value Date  ? CHOL 238 (H) 05/21/2021  ? CHOL 238 (H) 02/02/2020  ? ?Lab Results  ?Component Value Date  ? HDL 45.90 05/21/2021  ? HDL 47.50 02/02/2020  ? ?Lab Results  ?Component Value Date  ? LDLCALC 158 (H) 02/02/2020  ? ?Lab Results  ?Component Value Date  ? TRIG  211.0 (H) 05/21/2021  ? TRIG 165.0 (H) 02/02/2020  ? ?Lab Results  ?Component Value Date  ? CHOLHDL 5 05/21/2021  ? CHOLHDL 5 02/02/2020  ? ?Lab Results  ?Component Value Date  ? LDLDIRECT 173.0 05/21/2021  ?  ? ?Assessment  ?1. Abnormal findings on diagnostic imaging of lung   ?2. Primary osteoarthritis of both shoulders   ?3. Menopausal hot flushes   ?4. Mixed hyperlipidemia   ? ?  ?Plan  ?Pulmonary nodule: Order chest CT for further evaluation.  Education counseling done. 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  We elect sports medicine referral for further treatment options.  Meloxicam as needed for pain. ?Menopausal hot flashes improved on black cohosh twice daily.  We will continue for 3 to 6 months then reevaluate ?History of mixed hyperlipidemia: Patient will try to follow a low-fat diet.  Has history of prediabetes.  We will recheck fasting levels in June at her physical and then reevaluate need for statin.  Patient understands and agrees.  She is hesitant to start a statin. ? ?Follow up: For complete physical ?05/24/2022 ? ?Orders Placed This Encounter  ?Procedures  ? CT Chest Wo Contrast  ? Ambulatory referral to Sports Medicine  ? ?  Meds ordered this encounter  ?Medications  ? meloxicam (MOBIC) 7.5 MG tablet  ?  Sig: Take 1 tablet (7.5 mg total) by mouth daily as needed for pain.  ?  Dispense:  30 tablet  ?  Refill:  2  ? Black Cohosh 40 MG CAPS  ?  Sig: 1 cap bid  ?  Refill:  0  ? ?  ? ?I reviewed the patients updated PMH, FH, and SocHx.  ?  ?Patient Active Problem List  ? Diagnosis Date Noted  ? Primary osteoarthritis of both shoulders 03/11/2022  ? Abnormal findings on diagnostic imaging of lung 03/11/2022  ? Mixed hyperlipidemia 03/11/2022  ? Prediabetes 02/11/2022  ? Abdominal bloating 08/16/2020  ? Gastroesophageal reflux disease without esophagitis 08/16/2020  ? Menopausal hot flushes 08/16/2020  ? Obesity (BMI 30-39.9)  08/16/2020  ? ?Current Meds  ?Medication Sig  ? Black Cohosh 40 MG CAPS 1 cap bid  ? Cholecalciferol (VITAMIN D3) 25 MCG (1000 UT) CAPS Take 2 capsules by mouth daily.  ? meloxicam (MOBIC) 7.5 MG tablet Take 1 tablet (7.5 mg total) by mouth daily as needed for pain.  ? Multiple Vitamins-Minerals (MULTIPLE VITAMINS/WOMENS PO) Take 1 tablet by mouth daily.   ? Red Yeast Rice Extract (RED YEAST RICE PO) Take 2 tablets by mouth daily.   ? ? ?Allergies: ?Patient is allergic to penicillins. ?Family History: ?Patient family history includes Arthritis in her brother, father, mother, sister, and son; Cancer in her mother and sister; Diabetes in her brother, mother, and sister; High Cholesterol in her brother, mother, and sister; High blood pressure in her brother, father, mother, and sister; Stroke in her father. ?Social History:  ?Patient  reports that she has never smoked. She has never used smokeless tobacco. She reports that she does not currently use alcohol. She reports that she does not use drugs. ? ?Review of Systems: ?Constitutional: Negative for fever malaise or anorexia ?Cardiovascular: negative for chest pain ?Respiratory: negative for SOB or persistent cough ?Gastrointestinal: negative for abdominal pain ? ?Objective  ?Vitals: BP 118/80   Pulse 68   Temp 98 ?F (36.7 ?C)   Ht 5\' 6"  (1.676 m)   Wt 230 lb 6.4 oz (104.5 kg)   SpO2 95%   BMI 37.19 kg/m?  ?General: no acute distress , A&Ox3 ?Cardiovascular:  RRR without murmur or gallop.  ?Respiratory:  Good breath sounds bilaterally, CTAB with normal respiratory effort ? ? ? ? ?Commons side effects, risks, benefits, and alternatives for medications and treatment plan prescribed today were discussed, and the patient expressed understanding of the given instructions. Patient is instructed to call or message via MyChart if he/she has any questions or concerns regarding our treatment plan. No barriers to understanding were identified. We discussed Red Flag symptoms  and signs in detail. Patient expressed understanding regarding what to do in case of urgent or emergency type symptoms.  ?Medication list was reconciled, printed and provided to the patient in AVS. Patient instructions and summary information was reviewed with the patient as documented in the AVS. ?This note was prepared with assistance of Conservation officer, historic buildings. Occasional wrong-word or sound-a-like substitutions may have occurred due to the inherent limitations of voice recognition software ? ?This visit occurred during the SARS-CoV-2 public health emergency.  Safety protocols were in place, including screening questions prior to the visit, additional usage of staff PPE, and extensive cleaning of exam room while observing appropriate contact time as indicated for disinfecting solutions.  ? ?

## 2022-03-11 NOTE — Patient Instructions (Signed)
Please follow up as scheduled for your next visit with me: 05/24/2022  ? ?If you have any questions or concerns, please don't hesitate to send me a message via MyChart or call the office at [phone]. Thank you for visiting with [place] today! It's our pleasure caring for you.  ? ?We will call you with information regarding your referral appointment. Sports medicine specialist to help with your shoulder pain. You may use the meloxicam as needed for pain in the meantime.  If you do not hear from [place] within the next 2 weeks, please let me know. It can take 1-2 weeks to get appointments set up with the specialists.   ? ?We will also call you to get your Chest CT set up to evaluate the lung nodule.  ?Arthritis ?Arthritis is a term that is commonly used to refer to joint pain or joint disease. There are more than 100 types of arthritis. ?What are the causes? ?The most common cause of this condition is wear and tear of a joint. Other causes include: ?Gout. ?Inflammation of a joint. ?An infection of a joint. ?Sprains and other injuries near the joint. ?A reaction to medicines or drugs, or an allergic reaction. ?In some cases, the cause may not be known. ?What are the signs or symptoms? ?The main symptom of this condition is pain in the joint during movement. Other symptoms include: ?Redness, swelling, or stiffness at a joint. ?Warmth coming from the joint. ?Fever. ?Overall feeling of illness. ?How is this diagnosed? ?This condition may be diagnosed with a physical exam and tests, including: ?Blood tests. ?Urine tests. ?Imaging tests, such as X-rays, an MRI, or a CT scan. ?Sometimes, fluid is removed from a joint for testing. ?How is this treated? ?This condition may be treated with: ?Treatment of the cause, if it is known. ?Rest. ?Raising (elevating) the joint. ?Applying cold or hot packs to the joint. ?Medicines to improve symptoms and reduce inflammation. ?Injections of a steroid, such as cortisone, into the joint to help  reduce pain and inflammation. ?Depending on the cause of your arthritis, you may need to make lifestyle changes to reduce stress on your joint. Changes may include: ?Exercising more. ?Losing weight. ?Follow these instructions at home: ?Medicines ?Take over-the-counter and prescription medicines only as told by your health care provider. ?Do not take aspirin to relieve pain if your health care provider thinks that gout may be causing your pain. ?Activity ?Rest your joint if told by your health care provider. Rest is important when your disease is active and your joint feels painful, swollen, or stiff. ?Avoid activities that make the pain worse. Balance activity with rest. ?Exercise your joint regularly with range-of-motion exercises as told by your health care provider. Try doing low-impact exercise, such as: ?Swimming. ?Water aerobics. ?Biking. ?Walking. ?Managing pain, stiffness, and swelling ? ?  ? ?If directed, put ice on the affected joint. To do this: ?Put ice in a plastic bag. ?Place a towel between your skin and the bag. ?Leave the ice on for 20 minutes, 2-3 times a day. ?Remove the ice if your skin turns bright red. This is very important. If you cannot feel pain, heat, or cold, you have a greater risk of damage to the area. ?If your joint is swollen, raise (elevate) it above the level of your heart if directed by your health care provider. ?If your joint feels stiff in the morning, try taking a warm shower. ?If directed, apply heat to the affected area as often  as told by your health care provider. Use the heat source that your health care provider recommends, such as a moist heat pack or a heating pad. To apply heat: ?Place a towel between your skin and the heat source. ?Leave the heat on for 20-30 minutes. ?Remove the heat if your skin turns bright red. This is especially important if you are unable to feel pain, heat, or cold. You have a greater risk of getting burned. ?General instructions ?Maintain a  healthy weight. Follow instructions from your health care provider for weight control. ?Do not use any products that contain nicotine or tobacco. These products include cigarettes, chewing tobacco, and vaping devices, such as e-cigarettes. If you need help quitting, ask your health care provider. ?Keep all follow-up visits. This is important. ?Where to find more information ?[employer]: www.niams.http://www.myers.net/ ?Contact a health care provider if: ?The pain gets worse. ?You have a fever. ?Get help right away if: ?You develop severe joint pain, swelling, or redness. ?Many joints become painful and swollen. ?You develop severe back pain. ?You develop severe weakness in your leg. ?Summary ?Arthritis is a term that is commonly used to refer to joint pain or joint disease. There are more than 100 types of arthritis. ?The most common cause of this condition is wear and tear of a joint. Other causes include gout, inflammation or infection of the joint, sprains, or allergies. ?Symptoms of this condition include redness, swelling, or stiffness of the joint. Other symptoms include warmth, fever, or feeling ill. ?This condition is treated with rest, elevation, medicines, and applying cold or hot packs. ?Follow your health care provider's instructions about medicines, activity, exercises, and other home care treatments. ?This information is not intended to replace advice given to you by your health care provider. Make sure you discuss any questions you have with your health care provider. ?Document Revised: 08/21/2021 Document Reviewed: 08/21/2021 ?Elsevier Patient Education ? 2023 [employer]. ? ?Pulmonary Nodule ? ?A pulmonary nodule is a small, round growth of tissue in the lung. It is sometimes referred to as a shadow or a spot on the lung. Nodules can vary in size, and most measure less than ? of an inch (10 mm). Nodules bigger than 1.2 inches (3 cm) are called lung masses. A pulmonary nodule is sometimes found  during a routine chest X-ray or while other imaging tests are done to check for other problems. ?Pulmonary nodules can be either noncancerous (benign) or cancerous (malignant). Most are noncancerous. Smaller nodules in people who do not smoke and who do not have any other risk factors for lung cancer are more likely to be noncancerous. Larger, irregular nodules in people who smoke or who have a strong family history of lung cancer are more likely to be cancerous. ?What are the causes? ?This condition may be caused by: ?A bacterial, fungal, or viral infection, such as tuberculosis. The infection is usually an old and inactive one. ?Cancerous tissue, such as lung cancer or a cancer in another part of the body that has spread to the lung. ?A noncancerous mass of tissue. ?Inflammation from conditions such as rheumatoid arthritis. ?Abnormal blood vessels in the lungs. ?What are the signs or symptoms? ?Usually, there are no symptoms of this condition. If symptoms appear, they are usually related to the underlying cause. For example, if the condition is caused by an infection, you may have a cough or a fever. ?How is this diagnosed? ?This condition is usually diagnosed with an X-ray or CT  scan. To help determine whether a pulmonary nodule is benign or malignant, your health care provider will: ?Take your medical history. ?Perform a physical exam. ?Order tests. These may include: ?Chest X-rays. ?A CT scan. This test shows smaller pulmonary nodules more clearly and with more detail than an X-ray. ?A positron emission tomography (PET) scan. This test is done to check if a nodule is cancerous. During the test, a small amount of a radioactive substance is injected into the bloodstream. Then a picture is taken. ?Biopsy to evaluate for cancer or confirm a diagnosis. This procedure involves removing a tissue sample from the nodule by inserting a needle through the chest, placing a scope down into the lung, or doing open  surgery. ?A skin test called a tuberculin test. This test is done to check if you have been exposed to the germ that causes tuberculosis. ?Blood tests. ?How is this treated? ?Treatment for this condition depends

## 2022-03-21 ENCOUNTER — Ambulatory Visit: Payer: Managed Care, Other (non HMO) | Admitting: Family Medicine

## 2022-03-27 ENCOUNTER — Ambulatory Visit: Payer: Self-pay

## 2022-03-27 ENCOUNTER — Ambulatory Visit: Payer: Managed Care, Other (non HMO) | Admitting: Family Medicine

## 2022-03-27 VITALS — BP 124/82 | HR 71 | Ht 66.0 in | Wt 228.6 lb

## 2022-03-27 DIAGNOSIS — M25511 Pain in right shoulder: Secondary | ICD-10-CM

## 2022-03-27 DIAGNOSIS — M25512 Pain in left shoulder: Secondary | ICD-10-CM | POA: Diagnosis not present

## 2022-03-27 DIAGNOSIS — G8929 Other chronic pain: Secondary | ICD-10-CM

## 2022-03-27 DIAGNOSIS — M19012 Primary osteoarthritis, left shoulder: Secondary | ICD-10-CM

## 2022-03-27 DIAGNOSIS — M19011 Primary osteoarthritis, right shoulder: Secondary | ICD-10-CM | POA: Diagnosis not present

## 2022-03-27 NOTE — Patient Instructions (Addendum)
Thank you for coming in today.   I've referred you to Physical Therapy.  Let us know if you don't hear from them in one week.   Recheck back in 6 weeks   

## 2022-03-27 NOTE — Progress Notes (Signed)
? ?I, Brittany Lamb, LAT, ATC acting as a scribe for Brittany Graham, MD. ?   ?Subjective:   ? ?CC: Bilat shoulder pain ? ?HPI: Pt is a 66 y/o female c/o bilat shoulder pain ongoing for about 8 months. Pt notes pain is intermittent and varies. Pt notes she is very active. PCP gave her a L steroid injection on 02/11/22. Pt notes no lasting benefit from prior steroid injection. Pt locates pain through the lateral aspect of the upper arm w/ a "dull ache." ? ?She reports she had a steroid injection left shoulder with Dr. Marcello Fennel.  Upon chart review this was a subacromial injection.  Patient reports that she had immediate great response that only lasted about a week. ? ?Neck pain: no ?Radiates: yes- sometimes into forearm ?UE Numbness/tingling: no ?UE Weakness: yes- depending ?Aggravates: overhead motions, IR ?Treatments tried: prior steroid injection, meloxicam, TENS, ice,  ? ?Dx imaging: 02/22/22 R & L shoulder XR ? ?Pertinent review of Systems: No fevers or chills ? ?Relevant historical information: Prediabetes and obesity. ? ? ?Objective:   ? ?Vitals:  ? 03/27/22 1546  ?BP: 124/82  ?Pulse: 71  ?SpO2: 96%  ? ?General: Well Developed, well nourished, and in no acute distress.  ? ?MSK: Right shoulder: Normal-appearing ?Not particularly tender to palpation. ?Range of motion generally intact.  Slight limited in functional internal rotation. ?Strength intact. ?Positive Hawkins and Neer's test.  Positive empty can test. ? ?Left shoulder: Normal-appearing ?Nontender to palpation. ?Range of motion intact again slight limitation functional internal rotation. ?Strength is intact ?Positive Hawkins and Neer's test.  Positive empty can test. ? ? ? ?Lab and Radiology Results ? ?Diagnostic Limited MSK Ultrasound of: Right shoulder ?Biceps tendon is intact ?Subscapularis tendon is intact ?Supraspinatus tendon is intact with moderate subacromial bursitis. ?Infraspinatus tendon is intact ?AC joint mild effusion. ?Impression: Subacromial  bursitis ? ?EXAM: ?RIGHT SHOULDER - 2+ VIEW ?  ?COMPARISON:  No recent prior. ?  ?FINDINGS: ?Acromioclavicular and glenohumeral degenerative change. ?Calcification noted of the distal supraspinatus tendon consistent ?calcific supraspinatus tendinosis. No evidence fracture, ?dislocation, or separation. Questionable nodular density noted over ?the right lung, possibly overlapping structures. PA and lateral ?chest x-ray suggested further evaluation. ?  ?IMPRESSION: ?1. Acromioclavicular and glenohumeral degenerative change. 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 No acute bony ?abnormality. ?  ?2. Questionable nodular opacity over the right lung, possibly ?overlapping structures. PA and lateral x-ray suggested for further ?evaluation. ?  ?These results will be called to the ordering clinician or ?representative by the Radiologist Assistant, and communication ?documented in the PACS or [employer]. ?  ?  ?Electronically Signed ?  By: Maisie Fus  Register M.D. ?  On: 02/25/2022 08:44 ?  ?EXAM: ?LEFT SHOULDER - 2+ VIEW ?  ?COMPARISON:  No recent prior. ?  ?FINDINGS: ?Acromioclavicular and glenohumeral degenerative change. Punctate ?well-circumscribed bony density noted the base the glenoid, most ?likely degenerative. No other focal abnormalities identified. No ?evidence of dislocation or separation. Mild left base subsegmental ?atelectasis. ?  ?IMPRESSION: ?Acromioclavicular and glenohumeral degenerative change. Punctate ?well-circumscribed bony density noted at the base of the glenoid, ?most likely degenerative. No other focal bony abnormality ?identified. ?  ?  ?Electronically Signed ?  By: Maisie Fus  Register M.D. ?  On: 02/25/2022 05:43 ?  ?I, Brittany Lamb, personally (independently) visualized and performed the interpretation of the images attached in this note. ? ? ?Impression and Recommendations:   ? ?Assessment and Plan: ?66 y.o. female with bilateral shoulder pain thought to be due to primarily  subacromial bursitis.  She has minimal degenerative changes glenohumeral joint and AC joint on x-ray per my read.  Her symptoms are more consistent in my pain with subacromial bursitis.  She had a great immediate response to subacromial injection by her primary care provider Dr. Mardelle Matte in March however it did not provide lasting relief.  I am not optimistic that another injection is going to help her right away.  I think a good starting point at this point would be physical therapy.  Plan for PT referral and check back in about 6 to 8 weeks.  If not improved would consider repeat injection or MRI. ?Of note she needs extended our physical therapy.  Based on the location where she works and the hours [facility] on [address] is probably a good option. ? ?PDMP not reviewed this encounter. ?Orders Placed This Encounter  ?Procedures  ? [place] LIMITED JOINT SPACE STRUCTURES UP BILAT(NO LINKED CHARGES)  ?  Order Specific Question:   Reason for Exam (SYMPTOM  OR DIAGNOSIS REQUIRED)  ?  Answer:   bilateral shoulder pain  ?  Order Specific Question:   Preferred imaging location?  ?  Answer:   Adult [facility]  ? Ambulatory referral to Physical Therapy  ?  Referral Priority:   Routine  ?  Referral Type:   Physical Medicine  ?  Referral Reason:   Specialty Services Required  ?  Requested Specialty:   Physical Therapy  ?  Number of Visits Requested:   1  ? ?No orders of the defined types were placed in this encounter. ? ? ?Discussed warning signs or symptoms. Please see discharge instructions. Patient expresses understanding. ? ? ?The above documentation has been reviewed and is accurate and complete Brittany Lamb, M.D. ? ?

## 2022-04-03 ENCOUNTER — Ambulatory Visit
Admission: RE | Admit: 2022-04-03 | Discharge: 2022-04-03 | Disposition: A | Discharge status: Home / Self Care | Payer: Managed Care, Other (non HMO) | Source: Ambulatory Visit | Attending: Family Medicine | Admitting: Family Medicine

## 2022-04-03 DIAGNOSIS — R918 Other nonspecific abnormal finding of lung field: Secondary | ICD-10-CM

## 2022-04-09 ENCOUNTER — Telehealth: Payer: Self-pay | Admitting: Family Medicine

## 2022-04-09 NOTE — Telephone Encounter (Signed)
Patient called wanting CT calcium score results - stated she had test done 3 weeks ago and does not understand mychart results. Would like a call back.  ?

## 2022-04-10 NOTE — Progress Notes (Signed)
Please call patient: I have reviewed his/her lung scan results. The nodule on the right is stable and appears benign. Nothing further is needed for that. There is another small nodule on the left that is new. We need to repeat the lung scan in 12 months. ?Other findings include an elevated diaphragm on the left, some lung scarring and liver cysts. Nothing is needed for these things at this time.  ?

## 2022-04-11 ENCOUNTER — Encounter: Payer: Self-pay | Admitting: Family Medicine

## 2022-04-12 NOTE — Telephone Encounter (Signed)
Pt is returning a call to our office to discuss CT scan. Asking for a call back

## 2022-04-15 NOTE — Telephone Encounter (Signed)
Spoke with pt regarding results and she will F/u in June.

## 2022-05-08 ENCOUNTER — Ambulatory Visit: Payer: Managed Care, Other (non HMO) | Admitting: Family Medicine

## 2022-05-08 VITALS — BP 128/82 | HR 65 | Ht 66.0 in | Wt 224.0 lb

## 2022-05-08 DIAGNOSIS — M25511 Pain in right shoulder: Secondary | ICD-10-CM | POA: Diagnosis not present

## 2022-05-08 DIAGNOSIS — G8929 Other chronic pain: Secondary | ICD-10-CM | POA: Diagnosis not present

## 2022-05-08 DIAGNOSIS — I2510 Atherosclerotic heart disease of native coronary artery without angina pectoris: Secondary | ICD-10-CM

## 2022-05-08 DIAGNOSIS — M25512 Pain in left shoulder: Secondary | ICD-10-CM | POA: Diagnosis not present

## 2022-05-08 NOTE — Patient Instructions (Addendum)
Thank you for coming in today.   Continue home exercises  Check back as needed

## 2022-05-08 NOTE — Progress Notes (Signed)
I, Philbert Riser, LAT, ATC acting as a scribe for Clementeen Graham, MD.  Eria Lozoya Bohan is a 66 y.o. female who presents to [facility] at [facility] today for f/u bilat shoulder pain  thought to be due to primarily subacromial bursitis. PCP gave her a L steroid injection on 02/11/22. Pt was last seen by Dr. Denyse Amass on 03/27/22 and was referred to [facility]. Today, pt reports both shoulder are feeling somewhat better. Pt notes pain will vary, depending on her activity level, esp w/ a lot of overhead activity. Pt is wanting to d/c from PT, because of cost. Pt was doing some painting over the weekend, and the shoulders felt sore. Pt got no long-lasting relief from steroid injection from PCP.  She does mention that she had immediate relief following the injection.  It just did not last very long.  Dx imaging: 02/22/22 R & L shoulder XR  Pertinent review of systems: No fevers or chills  Relevant historical information: Hyperlipidemia   Exam:  BP 128/82   Pulse 65   Ht 5\' 6"  (1.676 m)   Wt 224 lb (101.6 kg)   SpO2 96%   BMI 36.15 kg/m  General: Well Developed, well nourished, and in no acute distress.   MSK: Shoulders bilaterally normal.  Normal motion. Normal strength. Negative impingement testing.    Lab and Radiology Results  EXAM: RIGHT SHOULDER - 2+ VIEW   COMPARISON:  No recent prior.   FINDINGS: Acromioclavicular and glenohumeral degenerative change. Calcification noted of the distal supraspinatus tendon consistent calcific supraspinatus tendinosis. No evidence fracture, dislocation, or separation. Questionable nodular density noted over the right lung, possibly overlapping structures. PA and lateral chest x-ray suggested further evaluation.   IMPRESSION: 1. Acromioclavicular and glenohumeral degenerative change. Changes suggesting calcific supraspinatus tendinosis. No acute bony abnormality.   2. Questionable nodular opacity over the right lung,  possibly overlapping structures. PA and lateral x-ray suggested for further evaluation.   These results will be called to the ordering clinician or representative by the Radiologist Assistant, and communication documented in the PACS or .     Electronically Signed   By: [employer]  Register M.D.   On: 02/25/2022 08:44    EXAM: LEFT SHOULDER - 2+ VIEW   COMPARISON:  No recent prior.   FINDINGS: Acromioclavicular and glenohumeral degenerative change. Punctate well-circumscribed bony density noted the base the glenoid, most likely degenerative. No other focal abnormalities identified. No evidence of dislocation or separation. Mild left base subsegmental atelectasis.   IMPRESSION: Acromioclavicular and glenohumeral degenerative change. Punctate well-circumscribed bony density noted at the base of the glenoid, most likely degenerative. No other focal bony abnormality identified.     Electronically Signed   By: 04/27/2022  Register M.D.   On: 02/25/2022 05:43  I, 04/27/2022, personally (independently) visualized and performed the interpretation of the images attached in this note.      Assessment and Plan: 66 y.o. female with bilateral shoulder pain due to rotator cuff tendinopathy and impingement.  Improved with physical therapy.  At this point I think is reasonable to transition to home exercise program.  Happy to proceed with steroid injection anytime in the future.  She is doing pretty well for now.  She asked about her CT scan of her chest that she had in May.  She has several findings including lung nodules that will require follow-up CT scan in 6 to 12 months.  She also had pretty significant coronary artery calcification.  I think it is likely that her primary care provider would recommend starting a statin when she has her physical on June 30.   PDMP not reviewed this encounter. No orders of the defined types were placed in this encounter.  No orders of  the defined types were placed in this encounter.    Discussed warning signs or symptoms. Please see discharge instructions. Patient expresses understanding.   The above documentation has been reviewed and is accurate and complete Clementeen Graham, M.D.

## 2022-05-19 ENCOUNTER — Encounter: Payer: Self-pay | Admitting: Family Medicine

## 2022-05-20 ENCOUNTER — Other Ambulatory Visit: Payer: Self-pay | Admitting: Family Medicine

## 2022-05-20 DIAGNOSIS — Z1231 Encounter for screening mammogram for malignant neoplasm of breast: Secondary | ICD-10-CM

## 2022-05-24 ENCOUNTER — Encounter: Payer: Self-pay | Admitting: Family Medicine

## 2022-05-24 ENCOUNTER — Ambulatory Visit (INDEPENDENT_AMBULATORY_CARE_PROVIDER_SITE_OTHER): Payer: Managed Care, Other (non HMO) | Admitting: Family Medicine

## 2022-05-24 VITALS — BP 120/78 | HR 81 | Temp 97.8°F | Ht 66.0 in | Wt 223.2 lb

## 2022-05-24 DIAGNOSIS — R7303 Prediabetes: Secondary | ICD-10-CM | POA: Diagnosis not present

## 2022-05-24 DIAGNOSIS — Z0000 Encounter for general adult medical examination without abnormal findings: Secondary | ICD-10-CM | POA: Diagnosis not present

## 2022-05-24 DIAGNOSIS — R252 Cramp and spasm: Secondary | ICD-10-CM | POA: Diagnosis not present

## 2022-05-24 DIAGNOSIS — I2510 Atherosclerotic heart disease of native coronary artery without angina pectoris: Secondary | ICD-10-CM | POA: Diagnosis not present

## 2022-05-24 DIAGNOSIS — E782 Mixed hyperlipidemia: Secondary | ICD-10-CM

## 2022-05-24 DIAGNOSIS — E669 Obesity, unspecified: Secondary | ICD-10-CM | POA: Diagnosis not present

## 2022-05-24 DIAGNOSIS — R911 Solitary pulmonary nodule: Secondary | ICD-10-CM

## 2022-05-24 DIAGNOSIS — N951 Menopausal and female climacteric states: Secondary | ICD-10-CM

## 2022-05-24 DIAGNOSIS — F5101 Primary insomnia: Secondary | ICD-10-CM | POA: Insufficient documentation

## 2022-05-24 LAB — COMPREHENSIVE METABOLIC PANEL
ALT: 18 U/L (ref 0–35)
AST: 17 U/L (ref 0–37)
Albumin: 4.6 g/dL (ref 3.5–5.2)
Alkaline Phosphatase: 70 U/L (ref 39–117)
BUN: 19 mg/dL (ref 6–23)
CO2: 31 mEq/L (ref 19–32)
Calcium: 9.6 mg/dL (ref 8.4–10.5)
Chloride: 104 mEq/L (ref 96–112)
Creatinine, Ser: 1.13 mg/dL (ref 0.40–1.20)
GFR: 51.48 mL/min — ABNORMAL LOW (ref >60.00)
Glucose, Bld: 112 mg/dL — ABNORMAL HIGH (ref 70–99)
Potassium: 4.3 mEq/L (ref 3.5–5.1)
Sodium: 142 mEq/L (ref 135–145)
Total Bilirubin: 0.5 mg/dL (ref 0.2–1.2)
Total Protein: 6.4 g/dL (ref 6.0–8.3)

## 2022-05-24 LAB — TSH: TSH: 1.81 u[IU]/mL (ref 0.35–5.50)

## 2022-05-24 LAB — CBC WITH DIFFERENTIAL/PLATELET
Basophils Absolute: 0 10*3/uL (ref 0.0–0.1)
Basophils Relative: 0.8 % (ref 0.0–3.0)
Eosinophils Absolute: 0.1 10*3/uL (ref 0.0–0.7)
Eosinophils Relative: 1.5 % (ref 0.0–5.0)
HCT: 47.7 % — ABNORMAL HIGH (ref 36.0–46.0)
Hemoglobin: 15.9 g/dL — ABNORMAL HIGH (ref 12.0–15.0)
Lymphocytes Relative: 25.3 % (ref 12.0–46.0)
Lymphs Abs: 1 10*3/uL (ref 0.7–4.0)
MCHC: 33.3 g/dL (ref 30.0–36.0)
MCV: 96.8 fl (ref 78.0–100.0)
Monocytes Absolute: 0.3 10*3/uL (ref 0.1–1.0)
Monocytes Relative: 7.9 % (ref 3.0–12.0)
Neutro Abs: 2.5 10*3/uL (ref 1.4–7.7)
Neutrophils Relative %: 64.5 % (ref 43.0–77.0)
Platelets: 170 10*3/uL (ref 150.0–400.0)
RBC: 4.93 Mil/uL (ref 3.87–5.11)
RDW: 12.9 % (ref 11.5–15.5)
WBC: 3.9 10*3/uL — ABNORMAL LOW (ref 4.0–10.5)

## 2022-05-24 LAB — LIPID PANEL
Cholesterol: 216 mg/dL — ABNORMAL HIGH (ref 0–200)
HDL: 40.7 mg/dL (ref >39.00)
LDL Cholesterol: 142 mg/dL — ABNORMAL HIGH (ref 0–99)
NonHDL: 174.94
Total CHOL/HDL Ratio: 5
Triglycerides: 164 mg/dL — ABNORMAL HIGH (ref 0.0–149.0)
VLDL: 32.8 mg/dL (ref 0.0–40.0)

## 2022-05-24 LAB — HEMOGLOBIN A1C: Hgb A1c MFr Bld: 6.1 % (ref 4.6–6.5)

## 2022-05-24 LAB — VITAMIN D 25 HYDROXY (VIT D DEFICIENCY, FRACTURES): VITD: 42.28 ng/mL (ref 30.00–100.00)

## 2022-05-24 LAB — MAGNESIUM: Magnesium: 2 mg/dL (ref 1.5–2.5)

## 2022-05-24 MED ORDER — GABAPENTIN 300 MG PO CAPS: 300.0000 mg | ORAL_CAPSULE | Freq: Every day | ORAL | 3 refills | Status: DC

## 2022-05-24 NOTE — Progress Notes (Signed)
Subjective  Chief Complaint  Patient presents with   Annual Exam    Pt here for Annual exam and is currently fasting. Pt stated that she has not been sleeping for quite some time now and would like to discuss CT results.    HPI: Brittany Lamb is a 66 y.o. female who presents to [facility] at [place] today for a Female Wellness Visit. She also has the concerns and/or needs as listed above in the chief complaint. These will be addressed in addition to the Health Maintenance Visit.   Wellness Visit: annual visit with health maintenance review and exam without Pap  Health maintenance: Mammogram scheduled for next month.  Other screens are current.  Immunizations are up-to-date.  Overall she is feeling well.  Trying to eat better.  Chronic disease f/u and/or acute problem visit: (deemed necessary to be done in addition to the wellness visit): Atherosclerosis coronary arteries: Seen on CT scan.  Education given. Pulmonary nodules: Reviewed CT scan findings.  Recommend follow-up CT in 12 months.  Left lung nodule.  She does have some scarring at the bases.  She has no history of smoking or lung disease.  She denies shortness of breath. Hyperlipidemia: Recommended statin last year due to elevated atherosclerotic risk score.  Patient deferred.  Discussed indications, risks and benefits.  Patient is fasting for recheck today.  She has been eating better diet.  Indications include prediabetes and atherosclerosis. Complains of leg cramps at night, intermittently. Poor sleep, always has been a poor sleeper but worsening due to menopausal hot flashes.  Try black cohosh but this has not been helpful.  Melatonin is not helpful. Prediabetes: No symptoms of hyperglycemia.  Tries to eat a low glycemic index diet.  Assessment  1. Annual physical exam   2. Coronary artery calcification seen on CAT scan   3. Pulmonary nodule   4. Mixed hyperlipidemia   5. Obesity (BMI 30-39.9)   6. Leg  cramps   7. Menopausal hot flushes   8. Prediabetes   9. Primary insomnia      Plan  Female Wellness Visit: Age appropriate Health Maintenance and Prevention measures were discussed with patient. Included topics are cancer screening recommendations, ways to keep healthy (see AVS) including dietary and exercise recommendations, regular eye and dental care, use of seat belts, and avoidance of moderate alcohol use and tobacco use.  Mammogram scheduled BMI: discussed patient's BMI and encouraged positive lifestyle modifications to help get to or maintain a target BMI. HM needs and immunizations were addressed and ordered. See below for orders. See HM and immunization section for updates. Routine labs and screening tests ordered including cmp, cbc and lipids where appropriate. Discussed recommendations regarding Vit D and calcium supplementation (see AVS)  Chronic disease management visit and/or acute problem visit: Atherosclerosis with history of borderline elevated cholesterol levels and prediabetes: Discussed benefits and indications for statin.  She would like to recheck her levels and she would consider treatment.  Recommend low-fat diet. Pulmonary nodule: Recheck CT scan in 1 year Leg cramps: Recommend stretching.  Check electrolytes and magnesium.  Good hydration Prediabetes: Continue to recommend healthy diet.  Weight loss.  Recheck A1c today. Primary and secondary insomnia due to hot flushes: Start gabapentin nightly.  Education given.  Follow-up 3 months.  Can consider SSRIs or HRT if needed.  Could also try trazodone.  Follow up: 3 months to recheck Orders Placed This Encounter  Procedures   CBC with Differential/Platelet  Comp Met (CMET)   Lipid Profile   Hemoglobin A1c   TSH   VITAMIN D 25 Hydroxy (Vit-D Deficiency, Fractures)   Magnesium   No orders of the defined types were placed in this encounter.     Body mass index is 36.03 kg/m. Wt Readings from Last 3  Encounters:  05/24/22 223 lb 3.2 oz (101.2 kg)  05/08/22 224 lb (101.6 kg)  03/27/22 228 lb 9.6 oz (103.7 kg)     Patient Active Problem List   Diagnosis Date Noted   Primary insomnia 05/24/2022   Coronary artery calcification seen on CAT scan 05/08/2022    Incidentally noted CT scan chest 2023    Primary osteoarthritis of both shoulders 03/11/2022    xrays 01/2022: AC and GH OA bilaterally; right with tendinopathy changes as welll; referred to sm 02/2022    Pulmonary nodule 03/11/2022    Lung nodule: CT f/u ordered 02/2022 03/2022: right nodule benign but new nodule on left. rec repeat 02/2023.     Mixed hyperlipidemia 03/11/2022   Prediabetes 02/11/2022   Abdominal bloating 08/16/2020   Gastroesophageal reflux disease without esophagitis 08/16/2020   Menopausal hot flushes 08/16/2020   Obesity (BMI 30-39.9) 08/16/2020   Health Maintenance  Topic Date Due   MAMMOGRAM  02/16/2022   INFLUENZA VACCINE  06/25/2022   COLONOSCOPY (Pts 45-31yr Insurance coverage will need to be confirmed)  11/25/2026   TETANUS/TDAP  02/01/2030   Hepatitis C Screening  Completed   HIV Screening  Completed   Zoster Vaccines- Shingrix  Completed   HPV VACCINES  Aged Out   COVID-19 Vaccine  Discontinued   Immunization History  Administered Date(s) Administered   Tdap 02/02/2020   Zoster Recombinat (Shingrix) 04/02/2021, 06/06/2021   We updated and reviewed the patient's past history in detail and it is documented below. Allergies: Patient is allergic to penicillins. Past Medical History Patient  has a past medical history of Chicken pox and GERD (gastroesophageal reflux disease). Past Surgical History Patient  has a past surgical history that includes Abdominal hysterectomy; Replacement total knee (Left); Gallbladder surgery; and Tonsillectomy. Family History: Patient family history includes Arthritis in her brother, father, mother, sister, and son; Cancer in her mother and sister; Diabetes in  her brother, mother, and sister; High Cholesterol in her brother, mother, and sister; High blood pressure in her brother, father, mother, and sister; Stroke in her father. Social History:  Patient  reports that she has never smoked. She has never used smokeless tobacco. She reports that she does not currently use alcohol. She reports that she does not use drugs.  Review of Systems: Constitutional: negative for fever or malaise Ophthalmic: negative for photophobia, double vision or loss of vision Cardiovascular: negative for chest pain, dyspnea on exertion, or new LE swelling Respiratory: negative for SOB or persistent cough Gastrointestinal: negative for abdominal pain, change in bowel habits or melena Genitourinary: negative for dysuria or gross hematuria, no abnormal uterine bleeding or disharge Musculoskeletal: negative for new gait disturbance or muscular weakness Integumentary: negative for new or persistent rashes, no breast lumps Neurological: negative for TIA or stroke symptoms Psychiatric: negative for SI or delusions Allergic/Immunologic: negative for hives  Patient Care Team    Relationship Specialty Notifications Start End  ALeamon Arnt MD PCP - General Family Medicine  02/02/20   DDoran Stabler MD Consulting Physician Gastroenterology  05/21/21   CGregor Hams MD Consulting Physician Family Medicine  05/24/22     Objective  Vitals: BP  120/78   Pulse 81   Temp 97.8 F (36.6 C)   Ht 5' 6"  (1.676 m)   Wt 223 lb 3.2 oz (101.2 kg)   SpO2 95%   BMI 36.03 kg/m  General:  Well developed, well nourished, no acute distress  Psych:  Alert and orientedx3,normal mood and affect HEENT:  Normocephalic, atraumatic, non-icteric sclera,  supple neck without adenopathy, mass or thyromegaly Cardiovascular:  Normal S1, S2, RRR without gallop, rub or murmur Respiratory:  Good breath sounds bilaterally, CTAB with normal respiratory effort Gastrointestinal: normal bowel sounds,  soft, non-tender, no noted masses. No HSM MSK: no deformities, contusions. Joints are without erythema or swelling.  Skin:  Warm, no rashes or suspicious lesions noted Neurologic:    Mental status is normal. Gross motor and sensory exams are normal. Normal gait. No tremor   Commons side effects, risks, benefits, and alternatives for medications and treatment plan prescribed today were discussed, and the patient expressed understanding of the given instructions. Patient is instructed to call or message via MyChart if he/she has any questions or concerns regarding our treatment plan. No barriers to understanding were identified. We discussed Red Flag symptoms and signs in detail. Patient expressed understanding regarding what to do in case of urgent or emergency type symptoms.  Medication list was reconciled, printed and provided to the patient in AVS. Patient instructions and summary information was reviewed with the patient as documented in the AVS. This note was prepared with assistance of Dragon voice recognition software. Occasional wrong-word or sound-a-like substitutions may have occurred due to the inherent limitations of voice recognition software  This visit occurred during the SARS-CoV-2 public health emergency.  Safety protocols were in place, including screening questions prior to the visit, additional usage of staff PPE, and extensive cleaning of exam room while observing appropriate contact time as indicated for disinfecting solutions.

## 2022-05-24 NOTE — Patient Instructions (Signed)
Please return in 3 months for recheck.   I will release your lab results to you on your MyChart account with further instructions. You may see the results before I do, but when I review them I will send you a message with my report or have my assistant call you if things need to be discussed. Please reply to my message with any questions. Thank you!   If you have any questions or concerns, please don't hesitate to send me a message via MyChart or call the office at [phone]. Thank you for visiting with [place] today! It's our pleasure caring for you.   Atherosclerosis  Atherosclerosis is when plaque builds up in the arteries. This causes narrowing and hardening of the arteries. Arteries are blood vessels that carry blood from the heart to all parts of the body. This blood contains oxygen. Plaque occurs due to inflammation or from a buildup of fat, cholesterol, calcium, waste products of cells, and a clotting material in the blood (fibrin). Plaque decreases the amount of blood that can flow through the artery. Atherosclerosis can affect any artery in your body, including: Heart arteries. Damage to these arteries may lead to coronary artery disease, which can cause a heart attack. Brain arteries. Damage to these arteries may cause a stroke. Leg, arm, and pelvis arteries. Peripheral artery disease (PAD) may result from damage to these arteries. Kidney arteries. Kidney (renal) failure may result from damage to kidney arteries. Treatment may slow the disease and prevent further damage to your heart, brain, peripheral arteries, and kidneys. What are the causes? This condition develops slowly over many years. The inner layers of your arteries become damaged and allow the gradual buildup of plaque. The exact cause of atherosclerosis is not fully understood. Symptoms of atherosclerosis do not occur until an artery becomes narrow or blocked. What increases the risk? The following factors may make you more  likely to develop this condition: Being middle-aged or older. Certain medical conditions, including: High blood pressure. High cholesterol. High blood fats (triglycerides). Diabetes. Sleep apnea. Obesity. Certain lab levels, including: Elevated C-reactive protein (CRP). This is a sign of increased inflammation in your body. Elevated homocysteine levels. This is an amino acid that is associated with heart and blood vessel disease. Using tobacco or nicotine products. A family history of atherosclerosis. Not exercising enough (sedentary lifestyle). Being stressed. Drinking too much alcohol or using drugs, such as cocaine or methamphetamine. What are the signs or symptoms? Symptoms of atherosclerosis do not occur until the plaque severely narrows or blocks the artery, which decreases blood flow. Sometimes, atherosclerosis does not cause symptoms. Symptoms of this condition include: Coronary artery disease. This may cause chest pain and shortness of breath. Decreased blood supply to your brain, which may cause a stroke. Signs of a stroke may include sudden: Weakness or numbness in your face, arm, or leg, especially on one side of your body. Trouble walking or difficulty moving your arms or legs. Loss of balance or coordination. Confusion. Slurred speech. Trouble speaking, or trouble understanding speech, or both (aphasia). Vision changes in one or both eyes. This may be double vision, blurred vision, or loss of vision. Severe headache with no known cause. The headache is often described as the worst headache ever experienced. PAD, which may cause pain, numbness, or nonhealing wounds, often in your legs and hips. Renal failure. This may cause tiredness, problems with urination, swelling, and itchy skin. How is this diagnosed? This condition is diagnosed based on your medical history  and a physical exam. During the exam, your health care provider will: Check your pulse in different  places. Listen for a "whooshing" sound over your arteries (bruit). You may also have tests, such as: Blood tests to check your levels of cholesterol, triglycerides, blood sugar, and CRP. Ankle-brachial index to compare blood pressure in your arms to blood pressure in your ankles to see how your blood is flowing. Heart (cardiac) tests. Electrocardiogram (ECG) to check for heart damage. Stress test to see how your heart reacts to exercise. Ultrasound tests. Ultrasound of your peripheral arteries to check blood flow. Echocardiogram to get images of your heart's chambers and valves. X-ray tests. Chest X-ray to see if you have an enlarged heart, which is a sign of heart failure. CT scan to check for damage to your heart, brain, or arteries. Angiogram. This is a test where dye is injected and X-rays are used to see the blood flow in the arteries. How is this treated? This condition is treated with lifestyle changes as the first step. These may include: Changing your diet. Losing weight. Reducing stress. Exercising and being physically active more regularly. Quitting smoking. You may also need medicine to: Lower triglycerides and cholesterol. Control blood pressure. Prevent blood clots. Lower inflammation in your body. Control your blood sugar. Sometimes, surgery is needed to: Remove plaque from an artery (endarterectomy). Open or widen a narrowed heart artery or peripheral artery (angioplasty). Create a new path for your blood with one of these procedures: Heart (coronary) artery bypass graft surgery. Peripheral artery bypass graft surgery. Place a small mesh tube (stent) in an artery to open or widen a narrowed artery. Follow these instructions at home: Eating and drinking  Eat a heart-healthy diet. Talk with your health care provider or a dietitian if you need help. A heart-healthy diet involves: Limiting unhealthy fats and increasing healthy fats. Some examples of healthy fats are  avocados and olive oil. Eating plant-based foods, such as fruits, vegetables, nuts, whole grains, and legumes (such as peas and lentils). If you drink alcohol: Limit how much you have to: 0-1 drink a day for women who are not pregnant. 0-2 drinks a day for men. Know how much alcohol is in a drink. In the [place] one drink equals one 12 oz bottle of beer (355 mL), one 5 oz glass of wine (148 mL), or one 1 oz glass of hard liquor (44 mL). Lifestyle  Maintain a healthy weight. Lose weight if your health care provider says that you need to do that. Follow an exercise program as told by your health care provider. Do not use any products that contain nicotine or tobacco. These products include cigarettes, chewing tobacco, and vaping devices, such as e-cigarettes. If you need help quitting, ask your health care provider. Do not use drugs. General instructions Take over-the-counter and prescription medicines only as told by your health care provider. Manage other health conditions as told. Keep all follow-up visits. This is important. Contact a health care provider if you have: An irregular heartbeat. Unexplained tiredness (fatigue). Trouble urinating, or you are producing less urine or foamy urine. Swelling of your hands or feet, or itchy skin. Unexplained pain or numbness in your legs or hips. A wound that is slow to heal or is not healing. Get help right away if: You have any symptoms of a heart attack. These may be: Chest pain. This includes squeezing chest pain that may feel like indigestion (angina). Shortness of breath. Pain in your neck, jaw,  arms, back, or stomach. Cold sweat. Nausea. Light-headedness. Sudden pain, numbness, or coldness in a limb. You have any symptoms of a stroke. "BE FAST" is an easy way to remember the main warning signs of a stroke: B - Balance. Signs are dizziness, sudden trouble walking, or loss of balance. E - Eyes. Signs are trouble seeing or a sudden  change in vision. F - Face. Signs are sudden weakness or numbness of the face, or the face or eyelid drooping on one side. A - Arms. Signs are weakness or numbness in an arm. This happens suddenly and usually on one side of the body. S - Speech. Signs are sudden trouble speaking, slurred speech, or trouble understanding what people say. T - Time. Time to call emergency services. Write down what time symptoms started. You have other signs of a stroke, such as: A sudden, severe headache with no known cause. Nausea or vomiting. Seizure. These symptoms may represent a serious problem that is an emergency. Do not wait to see if the symptoms will go away. Get medical help right away. Call your local emergency services (911 in the [place] Do not drive yourself to the hospital. Summary Atherosclerosis is when plaque builds up in the arteries and causes narrowing and hardening of the arteries. Plaque occurs due to inflammation or from a buildup of fat, cholesterol, calcium, cellular waste products, and fibrin. This condition may not cause any symptoms. Symptoms of atherosclerosis do not occur until the plaque severely narrows or blocks the artery. Treatment starts with lifestyle changes and may include medicines. In some cases, surgery is needed. Get help right away if you have any symptoms of a heart attack or stroke. This information is not intended to replace advice given to you by your health care provider. Make sure you discuss any questions you have with your health care provider. Document Revised: 02/14/2021 Document Reviewed: 02/14/2021 Elsevier Patient Education  2023 [employer].

## 2022-05-27 MED ORDER — ROSUVASTATIN CALCIUM 5 MG PO TABS: 5.0000 mg | ORAL_TABLET | Freq: Every evening | ORAL | 3 refills | Status: DC

## 2022-06-07 ENCOUNTER — Ambulatory Visit
Admission: RE | Admit: 2022-06-07 | Discharge: 2022-06-07 | Disposition: A | Discharge status: Home / Self Care | Payer: Managed Care, Other (non HMO) | Source: Ambulatory Visit

## 2022-06-07 DIAGNOSIS — Z1231 Encounter for screening mammogram for malignant neoplasm of breast: Secondary | ICD-10-CM

## 2022-06-10 ENCOUNTER — Telehealth: Payer: Self-pay | Admitting: Family Medicine

## 2022-06-10 NOTE — Telephone Encounter (Signed)
LVM for pt to let her know that the breat center will contact her with future evaluations

## 2022-06-10 NOTE — Telephone Encounter (Signed)
Patient called stating she received her mammogram results which found a mass in her left breast. Upon reading this she wanted to know if she needed to schedule an additional mammogram for further evaluation or have a follow up OV with Dr. Mardelle Matte. Please Advise.

## 2022-06-11 ENCOUNTER — Other Ambulatory Visit: Payer: Self-pay | Admitting: Family Medicine

## 2022-06-11 ENCOUNTER — Encounter: Payer: Self-pay | Admitting: Family Medicine

## 2022-06-11 DIAGNOSIS — R928 Other abnormal and inconclusive findings on diagnostic imaging of breast: Secondary | ICD-10-CM

## 2022-06-18 ENCOUNTER — Ambulatory Visit
Admission: RE | Admit: 2022-06-18 | Discharge: 2022-06-18 | Disposition: A | Discharge status: Home / Self Care | Payer: Managed Care, Other (non HMO) | Source: Ambulatory Visit | Attending: Family Medicine | Admitting: Family Medicine

## 2022-06-18 DIAGNOSIS — R928 Other abnormal and inconclusive findings on diagnostic imaging of breast: Secondary | ICD-10-CM

## 2022-08-19 ENCOUNTER — Encounter: Payer: Self-pay | Admitting: *Deleted

## 2022-08-21 ENCOUNTER — Ambulatory Visit: Payer: Managed Care, Other (non HMO) | Admitting: Family Medicine

## 2022-08-26 ENCOUNTER — Ambulatory Visit: Payer: Managed Care, Other (non HMO) | Admitting: Family Medicine

## 2022-09-07 ENCOUNTER — Other Ambulatory Visit: Payer: Self-pay | Admitting: Family Medicine

## 2022-09-16 ENCOUNTER — Encounter: Payer: Self-pay | Admitting: Family Medicine

## 2022-09-16 ENCOUNTER — Ambulatory Visit: Payer: Managed Care, Other (non HMO) | Admitting: Family Medicine

## 2022-09-16 VITALS — BP 120/78 | HR 69 | Temp 97.8°F | Ht 66.0 in | Wt 228.2 lb

## 2022-09-16 DIAGNOSIS — F5101 Primary insomnia: Secondary | ICD-10-CM | POA: Diagnosis not present

## 2022-09-16 DIAGNOSIS — N951 Menopausal and female climacteric states: Secondary | ICD-10-CM

## 2022-09-16 DIAGNOSIS — R7303 Prediabetes: Secondary | ICD-10-CM | POA: Diagnosis not present

## 2022-09-16 DIAGNOSIS — E782 Mixed hyperlipidemia: Secondary | ICD-10-CM

## 2022-09-16 NOTE — Patient Instructions (Signed)
Please return in June 2024 for your annual complete physical; please come fasting.   I will release your lab results to you on your MyChart account with further instructions. You may see the results before I do, but when I review them I will send you a message with my report or have my assistant call you if things need to be discussed. Please reply to my message with any questions. Thank you!   If you have any questions or concerns, please don't hesitate to send me a message via MyChart or call the office at [phone]. Thank you for visiting with [place] today! It's our pleasure caring for you.

## 2022-09-16 NOTE — Progress Notes (Signed)
Subjective  CC:  Chief Complaint  Patient presents with   Prediabetes   Hyperlipidemia    HPI: Brittany Lamb is a 66 y.o. female who presents to the office today to address the problems listed above in the chief complaint. Hyperlipidemia: Start Crestor 5 mg nightly.  Tolerating well.  No adverse effects.  Nonfasting for recheck today. 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  However she never really started it.  Still with poor sleep.  Feels tired during the day.  Denies snoring.  Asking about B12 supplements and over-the-counter multivitamins Prediabetes: Up until few weeks ago has been eating well.  Denies symptoms of hypoglycemia.  Sister has diabetes.  She is trying to follow a high-protein diet.  Assessment  1. Prediabetes   2. Primary insomnia   3. Menopausal hot flushes   4. Mixed hyperlipidemia      Plan  Prediabetes: Continue high-protein diet, low-carb diet.  Recheck levels today. Insomnia: Recommend gabapentin 300 nightly.  Education given Mixed hyperlipidemia: Check nonfasting lipids on Crestor 5 mg nightly.  Adjust dose up if indicated.  Goal LDL less than 70 ideally Discussed calcium and vitamin D supplementation.  Follow up: 9 months for complete physical, June Visit date not found  Orders Placed This Encounter  Procedures   Comprehensive metabolic panel   Lipid panel   Hemoglobin A1c   POCT HgB A1C   No orders of the defined types were placed in this encounter.     I reviewed the patients updated PMH, FH, and SocHx.    Patient Active Problem List   Diagnosis Date Noted   Primary insomnia 05/24/2022   Coronary artery calcification seen on CAT scan 05/08/2022   Primary osteoarthritis of both shoulders 03/11/2022   Pulmonary nodule 03/11/2022   Mixed hyperlipidemia 03/11/2022   Prediabetes 02/11/2022   Abdominal bloating 08/16/2020   Gastroesophageal reflux disease without esophagitis  08/16/2020   Menopausal hot flushes 08/16/2020   Obesity (BMI 30-39.9) 08/16/2020   Current Meds  Medication Sig   Cholecalciferol (VITAMIN D3) 25 MCG (1000 UT) CAPS Take 2 capsules by mouth daily.   gabapentin (NEURONTIN) 300 MG capsule Take 1 capsule (300 mg total) by mouth at bedtime.   meloxicam (MOBIC) 7.5 MG tablet TAKE 1 TABLET BY MOUTH DAILY AS NEEDED FOR PAIN   Multiple Vitamins-Minerals (MULTIPLE VITAMINS/WOMENS PO) Take 1 tablet by mouth daily.    rosuvastatin (CRESTOR) 5 MG tablet Take 1 tablet (5 mg total) by mouth at bedtime.    Allergies: Patient is allergic to penicillins. Family History: Patient family history includes Arthritis in her brother, father, mother, sister, and son; Cancer in her mother and sister; Diabetes in her brother, mother, and sister; High Cholesterol in her brother, mother, and sister; High blood pressure in her brother, father, mother, and sister; Stroke in her father. Social History:  Patient  reports that she has never smoked. She has never used smokeless tobacco. She reports that she does not currently use alcohol. She reports that she does not use drugs.  Review of Systems: Constitutional: Negative for fever malaise or anorexia Cardiovascular: negative for chest pain Respiratory: negative for SOB or persistent cough Gastrointestinal: negative for abdominal pain  Objective  Vitals: BP 120/78   Pulse 69   Temp 97.8 F (36.6 C)   Ht 5\' 6"  (1.676 m)   Wt 228 lb 3.2 oz (103.5 kg)   SpO2 96%   BMI 36.83 kg/m  General:  no acute distress , A&Ox3 Lab Results  Component Value Date   HGBA1C 6.1 05/24/2022   Lab Results  Component Value Date   CHOL 216 (H) 05/24/2022   HDL 40.70 05/24/2022   LDLCALC 142 (H) 05/24/2022   LDLDIRECT 173.0 05/21/2021   TRIG 164.0 (H) 05/24/2022   CHOLHDL 5 05/24/2022   The 10-year ASCVD risk score (Arnett DK, et al., 2019) is: 5.3%   Values used to calculate the score:     Age: 65 years     Sex: Female      Is Non-Hispanic African American: No     Diabetic: No     Tobacco smoker: No     Systolic Blood Pressure: 299 mmHg     Is BP treated: No     HDL Cholesterol: 40.7 mg/dL     Total Cholesterol: 216 mg/dL   Commons side effects, risks, benefits, and alternatives for medications and treatment plan prescribed today were discussed, and the patient expressed understanding of the given instructions. Patient is instructed to call or message via MyChart if he/she has any questions or concerns regarding our treatment plan. No barriers to understanding were identified. We discussed Red Flag symptoms and signs in detail. Patient expressed understanding regarding what to do in case of urgent or emergency type symptoms.  Medication list was reconciled, printed and provided to the patient in AVS. Patient instructions and summary information was reviewed with the patient as documented in the AVS. This note was prepared with assistance of Dragon voice recognition software. Occasional wrong-word or sound-a-like substitutions may have occurred due to the inherent limitations of voice recognition software  This visit occurred during the SARS-CoV-2 public health emergency.  Safety protocols were in place, including screening questions prior to the visit, additional usage of staff PPE, and extensive cleaning of exam room while observing appropriate contact time as indicated for disinfecting solutions.

## 2022-09-17 LAB — LIPID PANEL
Cholesterol: 166 mg/dL (ref 0–200)
HDL: 47.3 mg/dL (ref >39.00)
LDL Cholesterol: 80 mg/dL (ref 0–99)
NonHDL: 118.77
Total CHOL/HDL Ratio: 4
Triglycerides: 196 mg/dL — ABNORMAL HIGH (ref 0.0–149.0)
VLDL: 39.2 mg/dL (ref 0.0–40.0)

## 2022-09-17 LAB — COMPREHENSIVE METABOLIC PANEL
ALT: 20 U/L (ref 0–35)
AST: 21 U/L (ref 0–37)
Albumin: 4.6 g/dL (ref 3.5–5.2)
Alkaline Phosphatase: 67 U/L (ref 39–117)
BUN: 17 mg/dL (ref 6–23)
CO2: 29 mEq/L (ref 19–32)
Calcium: 9.5 mg/dL (ref 8.4–10.5)
Chloride: 101 mEq/L (ref 96–112)
Creatinine, Ser: 0.89 mg/dL (ref 0.40–1.20)
GFR: 68.4 mL/min (ref >60.00)
Glucose, Bld: 94 mg/dL (ref 70–99)
Potassium: 3.9 mEq/L (ref 3.5–5.1)
Sodium: 137 mEq/L (ref 135–145)
Total Bilirubin: 0.5 mg/dL (ref 0.2–1.2)
Total Protein: 6.4 g/dL (ref 6.0–8.3)

## 2022-09-17 LAB — HEMOGLOBIN A1C: Hgb A1c MFr Bld: 6.3 % (ref 4.6–6.5)

## 2022-11-07 ENCOUNTER — Encounter: Payer: Self-pay | Admitting: *Deleted

## 2022-12-25 ENCOUNTER — Encounter: Payer: Self-pay | Admitting: Family Medicine

## 2023-01-29 ENCOUNTER — Ambulatory Visit: Payer: Managed Care, Other (non HMO) | Admitting: Family Medicine

## 2023-01-29 VITALS — BP 110/64 | HR 81 | Temp 97.9°F | Ht 66.0 in | Wt 236.2 lb

## 2023-01-29 DIAGNOSIS — T304 Corrosion of unspecified body region, unspecified degree: Secondary | ICD-10-CM | POA: Diagnosis not present

## 2023-01-29 NOTE — Progress Notes (Signed)
Subjective  CC:  Chief Complaint  Patient presents with   blisters on hands     Pt stated that she noticed some blisters on her Rt hand after stripping pint off furniture    HPI: Brittany Lamb is a 67 y.o. female who presents to the office today to address the problems listed above in the chief complaint. Paint stripper soaked through right glove last night: palm and fingers with blistering and edema. No open sores. Feels tight. Some pain relieved with tylenol.    Assessment  1. Chemical burn      Plan  Chemical burn:  care discussed: bandage during day; moisturizer/vaseling. If blisters open, then can add antibacterial ointment. Air dry at night. Will take 1-2 weeks to heal.   Follow up: as scheduled  05/28/2023  No orders of the defined types were placed in this encounter.  No orders of the defined types were placed in this encounter.     I reviewed the patients updated PMH, FH, and SocHx.    Patient Active Problem List   Diagnosis Date Noted   Primary insomnia 05/24/2022   Coronary artery calcification seen on CAT scan 05/08/2022   Primary osteoarthritis of both shoulders 03/11/2022   Pulmonary nodule 03/11/2022   Mixed hyperlipidemia 03/11/2022   Prediabetes 02/11/2022   Abdominal bloating 08/16/2020   Gastroesophageal reflux disease without esophagitis 08/16/2020   Menopausal hot flushes 08/16/2020   Obesity (BMI 30-39.9) 08/16/2020   Current Meds  Medication Sig   Cholecalciferol (VITAMIN D3) 25 MCG (1000 UT) CAPS Take 2 capsules by mouth daily.   gabapentin (NEURONTIN) 300 MG capsule Take 1 capsule (300 mg total) by mouth at bedtime.   meloxicam (MOBIC) 7.5 MG tablet TAKE 1 TABLET BY MOUTH DAILY AS NEEDED FOR PAIN   Multiple Vitamins-Minerals (MULTIPLE VITAMINS/WOMENS PO) Take 1 tablet by mouth daily.    rosuvastatin (CRESTOR) 5 MG tablet Take 1 tablet (5 mg total) by mouth at bedtime.    Allergies: Patient is allergic to penicillins. Family  History: Patient family history includes Arthritis in her brother, father, mother, sister, and son; Cancer in her mother and sister; Diabetes in her brother, mother, and sister; High Cholesterol in her brother, mother, and sister; High blood pressure in her brother, father, mother, and sister; Stroke in her father. Social History:  Patient  reports that she has never smoked. She has never used smokeless tobacco. She reports that she does not currently use alcohol. She reports that she does not use drugs.  Review of Systems: Constitutional: Negative for fever malaise or anorexia Cardiovascular: negative for chest pain Respiratory: negative for SOB or persistent cough Gastrointestinal: negative for abdominal pain  Objective  Vitals: BP 110/64   Pulse 81   Temp 97.9 F (36.6 C)   Ht '5\' 6"'$  (1.676 m)   Wt 236 lb 3.2 oz (107.1 kg)   SpO2 96%   BMI 38.12 kg/m  General: no acute distress , A&Ox3 Right hand: palm and fingers with unopened blisters, mainly 3rd and 4th fingers and skin edema.   Commons side effects, risks, benefits, and alternatives for medications and treatment plan prescribed today were discussed, and the patient expressed understanding of the given instructions. Patient is instructed to call or message via MyChart if he/she has any questions or concerns regarding our treatment plan. No barriers to understanding were identified. We discussed Red Flag symptoms and signs in detail. Patient expressed understanding regarding what to do in case of urgent or emergency  type symptoms.  Medication list was reconciled, printed and provided to the patient in AVS. Patient instructions and summary information was reviewed with the patient as documented in the AVS. This note was prepared with assistance of Dragon voice recognition software. Occasional wrong-word or sound-a-like substitutions may have occurred due to the inherent limitations of voice recognition software

## 2023-02-14 ENCOUNTER — Telehealth: Payer: Self-pay | Admitting: Family Medicine

## 2023-02-14 NOTE — Telephone Encounter (Signed)
Patient states she received a MyChart message stating Patient is over due for a Bone Density Dexa Scan.  Patient requests to be advised.

## 2023-03-07 ENCOUNTER — Encounter: Payer: Self-pay | Admitting: Family Medicine

## 2023-05-28 ENCOUNTER — Ambulatory Visit: Payer: BC Managed Care – PPO | Admitting: Family Medicine

## 2023-05-28 ENCOUNTER — Encounter: Payer: Self-pay | Admitting: Family Medicine

## 2023-05-28 VITALS — BP 124/82 | HR 61 | Temp 98.6°F | Ht 66.0 in | Wt 232.6 lb

## 2023-05-28 DIAGNOSIS — G8929 Other chronic pain: Secondary | ICD-10-CM

## 2023-05-28 DIAGNOSIS — Z780 Asymptomatic menopausal state: Secondary | ICD-10-CM

## 2023-05-28 DIAGNOSIS — N3281 Overactive bladder: Secondary | ICD-10-CM | POA: Diagnosis not present

## 2023-05-28 DIAGNOSIS — N951 Menopausal and female climacteric states: Secondary | ICD-10-CM

## 2023-05-28 DIAGNOSIS — Z0000 Encounter for general adult medical examination without abnormal findings: Secondary | ICD-10-CM

## 2023-05-28 DIAGNOSIS — R7303 Prediabetes: Secondary | ICD-10-CM

## 2023-05-28 DIAGNOSIS — E782 Mixed hyperlipidemia: Secondary | ICD-10-CM

## 2023-05-28 DIAGNOSIS — Z0001 Encounter for general adult medical examination with abnormal findings: Secondary | ICD-10-CM | POA: Diagnosis not present

## 2023-05-28 DIAGNOSIS — Z1231 Encounter for screening mammogram for malignant neoplasm of breast: Secondary | ICD-10-CM

## 2023-05-28 DIAGNOSIS — M25551 Pain in right hip: Secondary | ICD-10-CM | POA: Diagnosis not present

## 2023-05-28 DIAGNOSIS — M25562 Pain in left knee: Secondary | ICD-10-CM | POA: Diagnosis not present

## 2023-05-28 DIAGNOSIS — F5101 Primary insomnia: Secondary | ICD-10-CM

## 2023-05-28 DIAGNOSIS — R911 Solitary pulmonary nodule: Secondary | ICD-10-CM

## 2023-05-28 DIAGNOSIS — L80 Vitiligo: Secondary | ICD-10-CM

## 2023-05-28 HISTORY — DX: Overactive bladder: N32.81

## 2023-05-28 LAB — CBC WITH DIFFERENTIAL/PLATELET
Basophils Absolute: 0 10*3/uL (ref 0.0–0.1)
Basophils Relative: 0.7 % (ref 0.0–3.0)
Eosinophils Absolute: 0 10*3/uL (ref 0.0–0.7)
Eosinophils Relative: 1.5 % (ref 0.0–5.0)
HCT: 48.6 % — ABNORMAL HIGH (ref 36.0–46.0)
Hemoglobin: 16.1 g/dL — ABNORMAL HIGH (ref 12.0–15.0)
Lymphocytes Relative: 27.6 % (ref 12.0–46.0)
Lymphs Abs: 0.8 10*3/uL (ref 0.7–4.0)
MCHC: 33.2 g/dL (ref 30.0–36.0)
MCV: 94.7 fl (ref 78.0–100.0)
Monocytes Absolute: 0.2 10*3/uL (ref 0.1–1.0)
Monocytes Relative: 8.5 % (ref 3.0–12.0)
Neutro Abs: 1.8 10*3/uL (ref 1.4–7.7)
Neutrophils Relative %: 61.7 % (ref 43.0–77.0)
Platelets: 174 10*3/uL (ref 150.0–400.0)
RBC: 5.14 Mil/uL — ABNORMAL HIGH (ref 3.87–5.11)
RDW: 13.4 % (ref 11.5–15.5)
WBC: 2.9 10*3/uL — ABNORMAL LOW (ref 4.0–10.5)

## 2023-05-28 LAB — LIPID PANEL
Cholesterol: 169 mg/dL (ref 0–200)
HDL: 38.9 mg/dL — ABNORMAL LOW (ref >39.00)
LDL Cholesterol: 97 mg/dL (ref 0–99)
NonHDL: 130.45
Total CHOL/HDL Ratio: 4
Triglycerides: 168 mg/dL — ABNORMAL HIGH (ref 0.0–149.0)
VLDL: 33.6 mg/dL (ref 0.0–40.0)

## 2023-05-28 LAB — COMPREHENSIVE METABOLIC PANEL
ALT: 20 U/L (ref 0–35)
AST: 18 U/L (ref 0–37)
Albumin: 4.5 g/dL (ref 3.5–5.2)
Alkaline Phosphatase: 61 U/L (ref 39–117)
BUN: 14 mg/dL (ref 6–23)
CO2: 32 mEq/L (ref 19–32)
Calcium: 9.5 mg/dL (ref 8.4–10.5)
Chloride: 102 mEq/L (ref 96–112)
Creatinine, Ser: 1.01 mg/dL (ref 0.40–1.20)
GFR: 58.48 mL/min — ABNORMAL LOW (ref >60.00)
Glucose, Bld: 122 mg/dL — ABNORMAL HIGH (ref 70–99)
Potassium: 4.5 mEq/L (ref 3.5–5.1)
Sodium: 139 mEq/L (ref 135–145)
Total Bilirubin: 0.5 mg/dL (ref 0.2–1.2)
Total Protein: 6.3 g/dL (ref 6.0–8.3)

## 2023-05-28 LAB — TSH: TSH: 2.01 u[IU]/mL (ref 0.35–5.50)

## 2023-05-28 LAB — HEMOGLOBIN A1C: Hgb A1c MFr Bld: 6.2 % (ref 4.6–6.5)

## 2023-05-28 MED ORDER — MELOXICAM 15 MG PO TABS: 15.0000 mg | ORAL_TABLET | Freq: Every day | ORAL | 3 refills | Status: AC | PRN

## 2023-05-28 MED ORDER — GABAPENTIN 300 MG PO CAPS: 600.0000 mg | ORAL_CAPSULE | Freq: Every day | ORAL | 3 refills | Status: AC

## 2023-05-28 NOTE — Progress Notes (Signed)
Subjective  Chief Complaint  Patient presents with   Annual Exam    Physical ,  patient is fasting for labs, she also when to derm and was told that  abnormal labs  , discuss mobic dose,  due mammo, dexa     HPI: Brittany Lamb is a 67 y.o. female who presents to [facility] [place] [facility] today for a Female Wellness Visit. She also has the concerns and/or needs as listed above in the chief complaint. These will be addressed in addition to the Health Maintenance Visit.   Wellness Visit: annual visit with health maintenance review and exam without Pap  Health maintenance: Patient to schedule mammogram and bone density.  Colon cancer screening is up-to-date.  Overall doing well Chronic disease f/u and/or acute problem visit: (deemed necessary to be done in addition to the wellness visit): Hyperlipidemia on statin and fasting for recheck.  He has no concerns. Prediabetes but denies symptoms of hyperglycemia.  Tries to eat a low sugar diet.  Weight is stable Complains of mild overactive bladder symptoms.  Frequent urination without incontinence but some urgency.  No irritative symptoms.  No gross hematuria.  Nocturia once or twice nightly History of pulmonary nodule due for surveillance CT now.  No lung symptoms or hemoptysis Primary insomnia: Had been treated with trazodone which works fairly well but currently using an over-the-counter supplement with melatonin which she likes.  Also taking gabapentin 300 nightly to treat hot flashes.  Has not been that effective. She is taking Mobic 7.5 mg as needed but would like it increased.  I believe this was started when she was treated for shoulder pain by sports medicine, diagnosed with arthritis bilateral shoulders.  She reports she has knee pain and right hip pain.  Ongoing for many months.  No injuries.  Taking Mobic as needed which helps her back pain and joint pains.  No GERD symptoms.  No renal dysfunction.  Assessment  1. Annual  physical exam   2. Screening mammogram for breast cancer   3. Asymptomatic menopausal state   4. Mixed hyperlipidemia   5. Prediabetes   6. Pulmonary nodule   7. Primary insomnia   8. Menopausal hot flushes   9. OAB (overactive bladder) mild   10. Vitiligo      Plan  Female Wellness Visit: Age appropriate Health Maintenance and Prevention measures were discussed with patient. Included topics are cancer screening recommendations, ways to keep healthy (see AVS) including dietary and exercise recommendations, regular eye and dental care, use of seat belts, and avoidance of moderate alcohol use and tobacco use.  Mammogram this month.  Bone density ordered for screening. BMI: discussed patient's BMI and encouraged positive lifestyle modifications to help get to or maintain a target BMI. HM needs and immunizations were addressed and ordered. See below for orders. See HM and immunization section for updates. Routine labs and screening tests ordered including cmp, cbc and lipids where appropriate. Discussed recommendations regarding Vit D and calcium supplementation (see AVS)  Chronic disease management visit and/or acute problem visit: Discussed mild overactive bladder symptoms.  She will monitor.  Ensure not related to hyperglycemia with lab work today.  She will follow-up if worsening.  No medication started today. Recheck lipids for hyperlipidemia, continue rosuvastatin 5 mg nightly.  She is tolerating this well Hot flashes persist with insomnia: Increase gabapentin to 600 mg nightly.  Can continue melatonin supplements Prediabetes: Continue diabetic diet, work on weight loss and check A1c  today Left pulmonary nodule, new: Repeat CT scan ordered.  No lung symptoms present. Discussed dermatology diagnosis of vitiligo.  No lab work needed Knee and hip pain: Possibly related to arthritis or bursitis.  Discussed etiologies.  Discussed treatment with rest, ice and Mobic.  If not improving,  recommend follow-up for further evaluation and imaging studies.  Patient agrees  Follow up: 6 months for follow-up Orders Placed This Encounter  Procedures   CT CHEST NODULE FOLLOW UP LOW DOSE W/O   DG Bone Density   MM DIGITAL SCREENING BILATERAL   CBC with Differential/Platelet   Comprehensive metabolic panel   Lipid panel   TSH   Hemoglobin A1c   Meds ordered this encounter  Medications   gabapentin (NEURONTIN) 300 MG capsule    Sig: Take 2 capsules (600 mg total) by mouth at bedtime.    Dispense:  180 capsule    Refill:  3   meloxicam (MOBIC) 15 MG tablet    Sig: Take 1 tablet (15 mg total) by mouth daily as needed. for pain    Dispense:  90 tablet    Refill:  3      Body mass index is 37.54 kg/m. Wt Readings from Last 3 Encounters:  05/28/23 232 lb 9.6 oz (105.5 kg)  01/29/23 236 lb 3.2 oz (107.1 kg)  09/16/22 228 lb 3.2 oz (103.5 kg)     Patient Active Problem List   Diagnosis Date Noted   Coronary artery calcification seen on CAT scan 05/08/2022    Priority: High    Incidentally noted CT scan chest 2023    Mixed hyperlipidemia 03/11/2022    Priority: High   Prediabetes 02/11/2022    Priority: High   OAB (overactive bladder) mild 05/28/2023    Priority: Medium     Discussed sxs 05/2023    Primary insomnia 05/24/2022    Priority: Medium    Primary osteoarthritis of both shoulders 03/11/2022    Priority: Medium     xrays 01/2022: AC and GH OA bilaterally; right with tendinopathy changes as welll; referred to sm 02/2022    Gastroesophageal reflux disease without esophagitis 08/16/2020    Priority: Medium    Menopausal hot flushes 08/16/2020    Priority: Medium     Long-term, trial of gabapentin initiated 2024    Obesity (BMI 30-39.9) 08/16/2020    Priority: Medium    Pulmonary nodule 03/11/2022    Priority: Low    Lung nodule: CT f/u ordered 02/2022 03/2022: right nodule benign but new nodule on left. rec repeat 02/2023.     Vitiligo 05/28/2023    Abdominal bloating 08/16/2020   Health Maintenance  Topic Date Due   DEXA SCAN  Never done   Pneumonia Vaccine 25+ Years old (1 of 1 - PCV) 05/27/2024 (Originally 01/20/2023)   MAMMOGRAM  06/08/2023   INFLUENZA VACCINE  06/26/2023   Colonoscopy  11/25/2026   DTaP/Tdap/Td (2 - Td or Tdap) 02/01/2030   Hepatitis C Screening  Completed   HIV Screening  Completed   Zoster Vaccines- Shingrix  Completed   HPV VACCINES  Aged Out   COVID-19 Vaccine  Discontinued   Immunization History  Administered Date(s) Administered   Tdap 02/02/2020   Zoster Recombinant(Shingrix) 04/02/2021, 06/06/2021   We updated and reviewed the patient's past history in detail and it is documented below. Allergies: Patient is allergic to penicillins. Past Medical History Patient  has a past medical history of Chicken pox, GERD (gastroesophageal reflux disease), and OAB (overactive bladder)  mild (05/28/2023). Past Surgical History Patient  has a past surgical history that includes Abdominal hysterectomy; Replacement total knee (Left); Gallbladder surgery; and Tonsillectomy. Family History: Patient family history includes Arthritis in her brother, father, mother, sister, and son; Cancer in her mother and sister; Diabetes in her brother, mother, and sister; High Cholesterol in her brother, mother, and sister; High blood pressure in her brother, father, mother, and sister; Stroke in her father. Social History:  Patient  reports that she has never smoked. She has never used smokeless tobacco. She reports that she does not currently use alcohol. She reports that she does not use drugs.  Review of Systems: Constitutional: negative for fever or malaise Ophthalmic: negative for photophobia, double vision or loss of vision Cardiovascular: negative for chest pain, dyspnea on exertion, or new LE swelling Respiratory: negative for SOB or persistent cough Gastrointestinal: negative for abdominal pain, change in bowel habits or  melena Genitourinary: negative for dysuria or gross hematuria, no abnormal uterine bleeding or disharge Musculoskeletal: negative for new gait disturbance or muscular weakness Integumentary: negative for new or persistent rashes, no breast lumps Neurological: negative for TIA or stroke symptoms Psychiatric: negative for SI or delusions Allergic/Immunologic: negative for hives  Patient Care Team    Relationship Specialty Notifications Start End  Willow Ora, MD PCP - General Family Medicine  02/02/20   Sherrilyn Rist, MD Consulting Physician Gastroenterology  05/21/21   Rodolph Bong, MD Consulting Physician Family Medicine  05/24/22     Objective  Vitals: BP 124/82   Pulse 61   Temp 98.6 F (37 C)   Ht 5\' 6"  (1.676 m)   Wt 232 lb 9.6 oz (105.5 kg)   SpO2 97%   BMI 37.54 kg/m  General:  Well developed, well nourished, no acute distress  Psych:  Alert and orientedx3,normal mood and affect HEENT:  Normocephalic, atraumatic, non-icteric sclera,  supple neck without adenopathy, mass or thyromegaly Cardiovascular:  Normal S1, S2, RRR without gallop, rub or murmur Respiratory:  Good breath sounds bilaterally, CTAB with normal respiratory effort Gastrointestinal: normal bowel sounds, soft, non-tender, no noted masses. No HSM MSK: extremities without edema, joints without erythema or swelling Neurologic:    Mental status is normal.  Gross motor and sensory exams are normal.  No tremor  Commons side effects, risks, benefits, and alternatives for medications and treatment plan prescribed today were discussed, and the patient expressed understanding of the given instructions. Patient is instructed to call or message via MyChart if he/she has any questions or concerns regarding our treatment plan. No barriers to understanding were identified. We discussed Red Flag symptoms and signs in detail. Patient expressed understanding regarding what to do in case of urgent or emergency type symptoms.   Medication list was reconciled, printed and provided to the patient in AVS. Patient instructions and summary information was reviewed with the patient as documented in the AVS. This note was prepared with assistance of Dragon voice recognition software. Occasional wrong-word or sound-a-like substitutions may have occurred due to the inherent limitations of voice recognition software

## 2023-05-28 NOTE — Patient Instructions (Signed)
Please return in 12 months for your annual complete physical; please come fasting.   I will release your lab results to you on your MyChart account with further instructions. You may see the results before I do, but when I review them I will send you a message with my report or have my assistant call you if things need to be discussed. Please reply to my message with any questions. Thank you!   If you have any questions or concerns, please don't hesitate to send me a message via MyChart or call the office at [phone]. Thank you for visiting with [place] today! It's our pleasure caring for you.   Please call the office checked below to schedule your appointment for your mammogram and/or bone density screen (the checked studies were ordered): [x]   Mammogram  [x]   Bone Density  [x]   [facility]     [address] [place], [place]        [phone]         []   [facility]  [address] [place], [place]  [phone]   Calcium Intake Recommendations You can take Caltrate Plus twice a day or get it through your diet or other OTC supplements (Viactiv, OsCal (less constipation) etc)  Calcium is a mineral that affects many functions in the body, including: Blood clotting. Blood vessel function. Nerve impulse conduction. Hormone secretion. Muscle contraction. Bone and teeth functions.  Most of your body's calcium supply is stored in your bones and teeth. When your calcium stores are low, you may be at risk for low bone mass, bone loss, and bone fractures. Consuming enough calcium helps to grow healthy bones and teeth and to prevent breakdown over time. It is very important that you get enough calcium if you are: A child undergoing rapid growth. An adolescent girl. A pre- or post-menopausal woman. A woman whose menstrual cycle has stopped due to anorexia nervosa or regular intense exercise. An individual with lactose intolerance or a milk  allergy. A vegetarian.  What is my plan? Try to consume the recommended amount of calcium daily based on your age. Depending on your overall health, your health care provider may recommend increased calcium intake. General daily calcium intake recommendations by age are: Birth to 6 months: 200 mg. Infants 7 to 12 months: 260 mg. Children 1 to 3 years: 700 mg. Children 4 to 8 years: 1,000 mg. Children 9 to 13 years: 1,300 mg. Teens 14 to 18 years: 1,300 mg. Adults 19 to 50 years: 1,000 mg. Adult women 51 to 70 years: 1,200 mg. Adult men 51 to 70 years: 1,000 mg. Adults 71 years and older: 1,200 mg. Pregnant and breastfeeding teens: 1,300 mg. Pregnant and breastfeeding adults: 1,000 mg.  What do I need to know about calcium intake? In order for the body to absorb calcium, it needs vitamin D. You can get vitamin D through (we recommend getting [phone] units of Vitamin D daily) Direct exposure of the skin to sunlight. Foods, such as egg yolks, liver, saltwater fish, and fortified milk. Supplements. Consuming too much calcium may cause: Constipation. Decreased absorption of iron and zinc. Kidney stones. Calcium supplements may interact with certain medicines. Check with your health care provider before starting any calcium supplements. Try to get most of your calcium from food. What foods can I eat? Grains  Fortified oatmeal. Fortified ready-to-eat cereals. Fortified frozen waffles. Vegetables Turnip greens. Broccoli. Fruits Fortified orange  juice. Meats and Other Protein Sources Canned sardines with bones. Canned salmon with bones. Soy beans. Tofu. Baked beans. Almonds. [place] nuts. Sunflower seeds. Dairy Milk. Yogurt. Cheese. Cottage cheese. Beverages Fortified soy milk. Fortified rice milk. Sweets/Desserts Pudding. Ice Cream. Milkshakes. Blackstrap molasses. The items listed above may not be a complete list of recommended foods or beverages. Contact your dietitian for more  options. What foods can affect my calcium intake? It may be more difficult for your body to use calcium or calcium may leave your body more quickly if you consume large amounts of: Sodium. Protein. Caffeine. Alcohol.  This information is not intended to replace advice given to you by your health care provider. Make sure you discuss any questions you have with your health care provider. Document Released: 06/25/2004 Document Revised: 05/31/2016 Document Reviewed: 04/19/2014 Elsevier Interactive Patient Education  2018 [employer].

## 2023-06-10 ENCOUNTER — Other Ambulatory Visit: Payer: Self-pay | Admitting: Family Medicine

## 2023-06-10 DIAGNOSIS — D709 Neutropenia, unspecified: Secondary | ICD-10-CM

## 2023-06-10 DIAGNOSIS — D751 Secondary polycythemia: Secondary | ICD-10-CM

## 2023-06-10 MED ORDER — ROSUVASTATIN CALCIUM 5 MG PO TABS: 10.0000 mg | ORAL_TABLET | Freq: Every evening | ORAL | Status: DC

## 2023-06-10 NOTE — Progress Notes (Signed)
See mychart note Dear Ms. Skalsky, I apologize for the delay in getting these results back to you.  I have reviewed them. Your fasting sugar is very near the diabetic range at 122.  Your A1c remains in the prediabetic range.  Please work hard on eating a clean diabetic diet.  Your cholesterol levels are improved overall but I would like you to try Crestor 10 mg nightly instead of 5 mg to get better control.  Please double up on your current dose and if you tolerate it, let me know and I will order you the larger pill. Your blood counts show a low white blood cell count and elevated hemoglobin for unclear reasons.  Recommend seeing a hematologist for further evaluation. Sincerely, Dr. Mardelle Matte

## 2023-06-11 ENCOUNTER — Other Ambulatory Visit: Payer: Self-pay | Admitting: Family Medicine

## 2023-06-17 ENCOUNTER — Ambulatory Visit: Payer: BC Managed Care – PPO

## 2023-06-19 ENCOUNTER — Other Ambulatory Visit: Payer: BC Managed Care – PPO

## 2023-06-19 ENCOUNTER — Telehealth: Payer: Self-pay | Admitting: Family Medicine

## 2023-06-19 NOTE — Telephone Encounter (Addendum)
Referral coordinator has communicated that patient is concerned about Dr. Mardelle Matte being out of network.  States insurance company has advised.  Referral Coordinator did not get the same information when calling in ref to [facility].  Patient does not want to go through with order until we can confirm.  I have left patient a vm to call me back in regard.

## 2023-07-07 ENCOUNTER — Other Ambulatory Visit: Payer: Self-pay | Admitting: Adult Health Nurse Practitioner

## 2023-07-07 ENCOUNTER — Encounter: Payer: Self-pay | Admitting: Adult Health Nurse Practitioner

## 2023-07-07 DIAGNOSIS — Z1231 Encounter for screening mammogram for malignant neoplasm of breast: Secondary | ICD-10-CM

## 2023-07-07 DIAGNOSIS — M19012 Primary osteoarthritis, left shoulder: Secondary | ICD-10-CM

## 2023-07-07 DIAGNOSIS — M19011 Primary osteoarthritis, right shoulder: Secondary | ICD-10-CM

## 2023-07-23 ENCOUNTER — Encounter: Payer: BC Managed Care – PPO | Admitting: Nurse Practitioner

## 2023-07-23 ENCOUNTER — Other Ambulatory Visit: Payer: BC Managed Care – PPO

## 2023-10-22 IMAGING — DX DG CHEST 2V
2 series · 2 of 2 positions shown · non-contrast
Comparison: Shoulder plain film 02/22/2022

CLINICAL DATA: 66-year-old female with shoulder pain

EXAM:
CHEST - 2 VIEW

[chest pa]
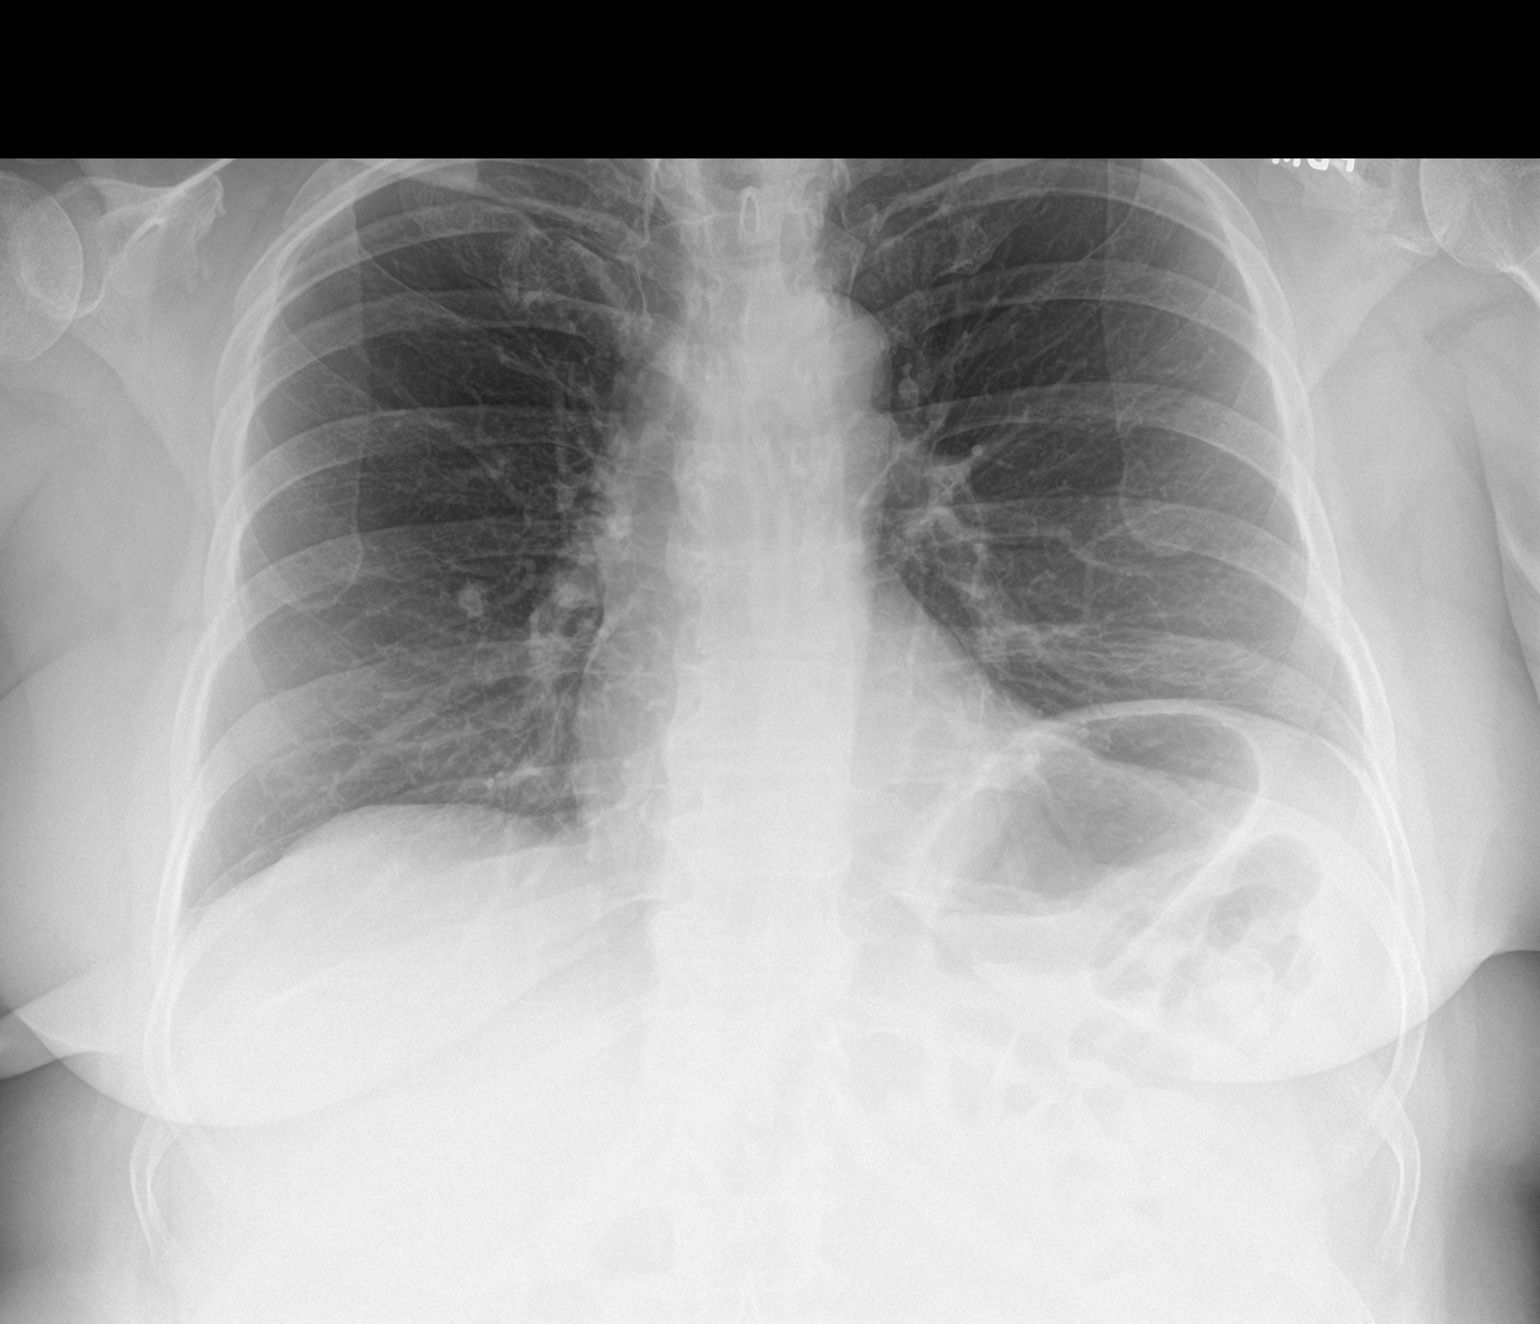

[chest lat]
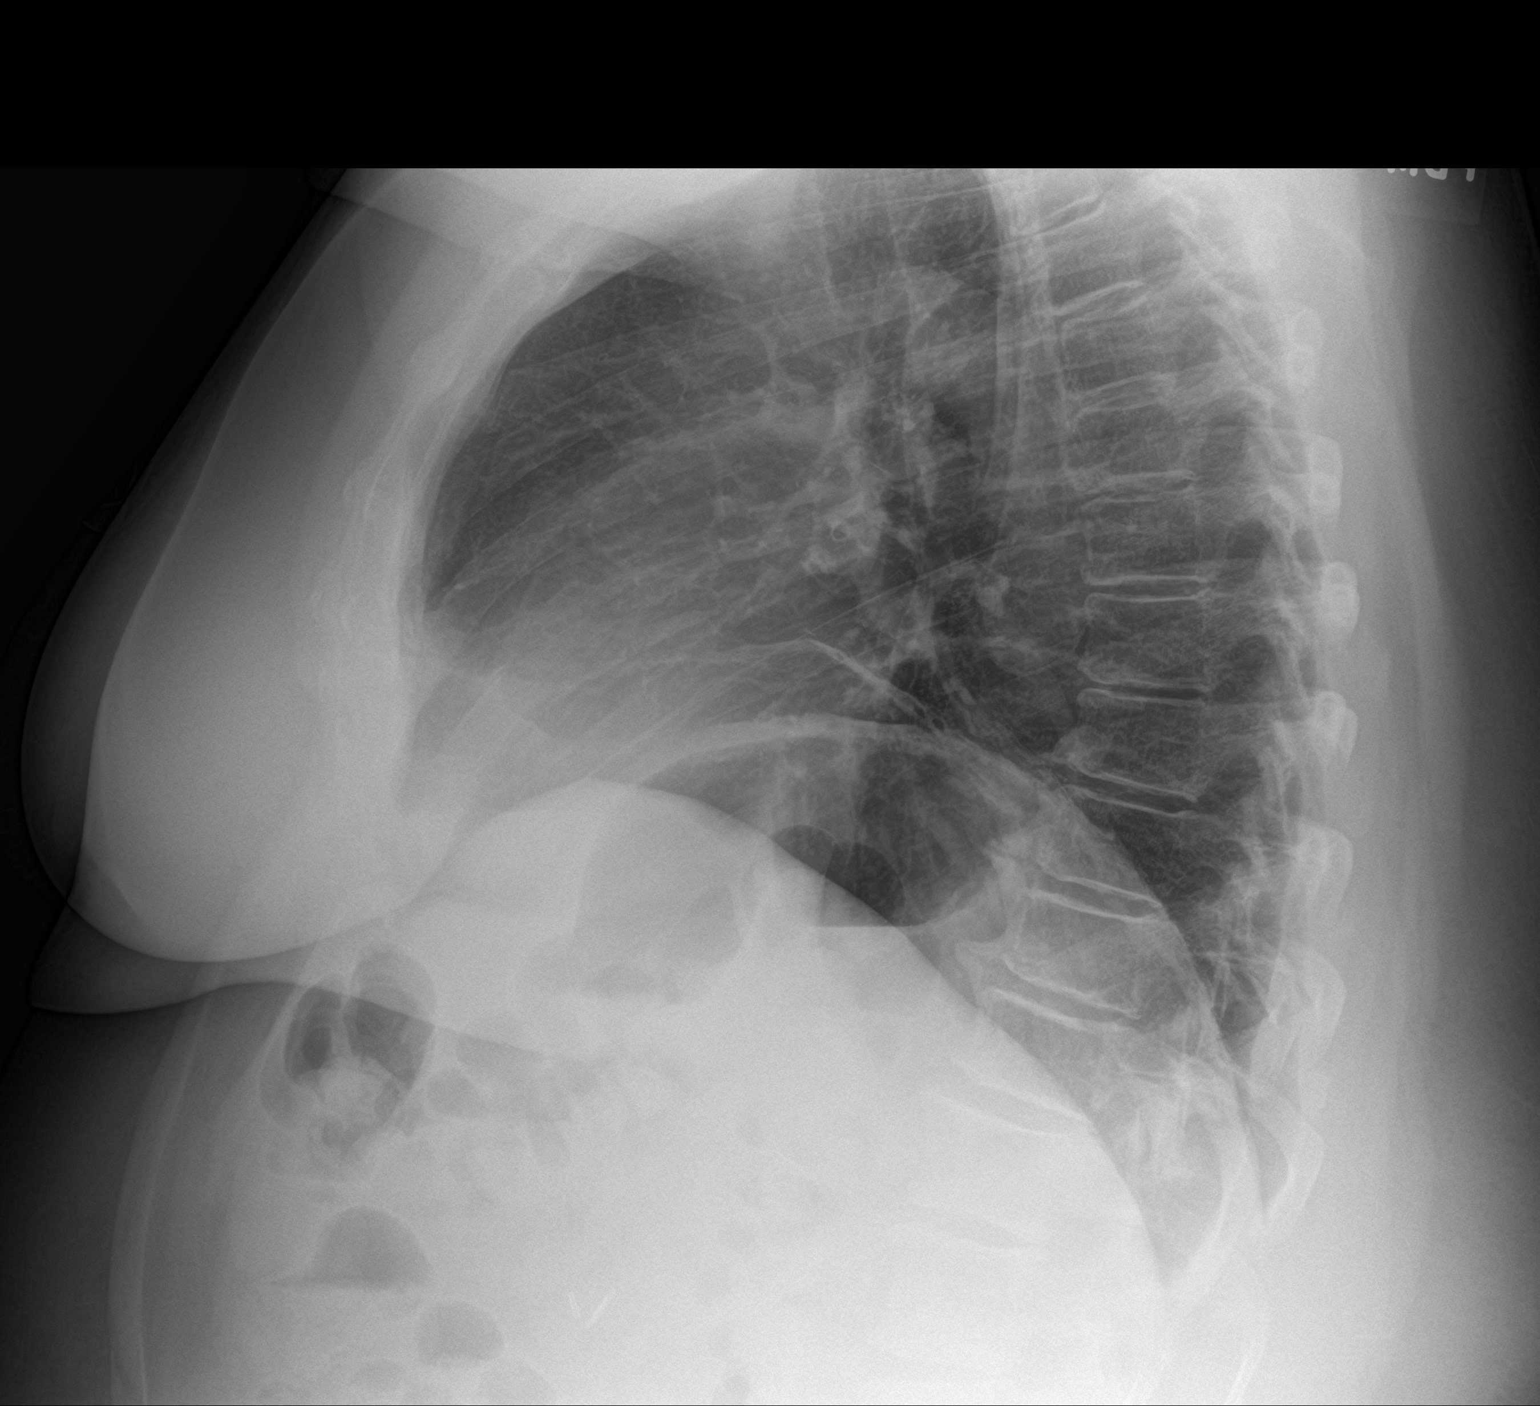

[2 of 2 positions shown; findings below may reference images not displayed]

FINDINGS: Cardiomediastinal silhouette within normal limits in size and
contour.

Low lung volumes.

No pneumothorax or pleural effusion.

Linear opacity at the left lung base, compatible with
scarring/atelectasis.

Small nodular density lateral to the right hilar region, likely the
corresponding finding from the prior plain film shoulder.

No confluent airspace disease.

No displaced fracture.  Degenerative changes the spine.
IMPRESSION: Negative for acute cardiopulmonary disease.

Small nodular density lateral to the right hilum, likely the
correlate to the prior shoulder plain film. Given the density this
is most likely a granuloma, however is nonspecific and further
evaluation with chest CT is recommended.

## 2023-11-19 IMAGING — CT CT CHEST W/O CM
1 of 2 series · 14 of 32 positions shown, 18 images · non-contrast
Comparison: Chest radiographs done on 03/06/2022

CLINICAL DATA: Lung nodule



[Series 6: super d · axial · 0.91mm/px · z∈[-327,-72]mm · 14 of 357 slices shown, 18 images]
[im 19/357  mediastinal]
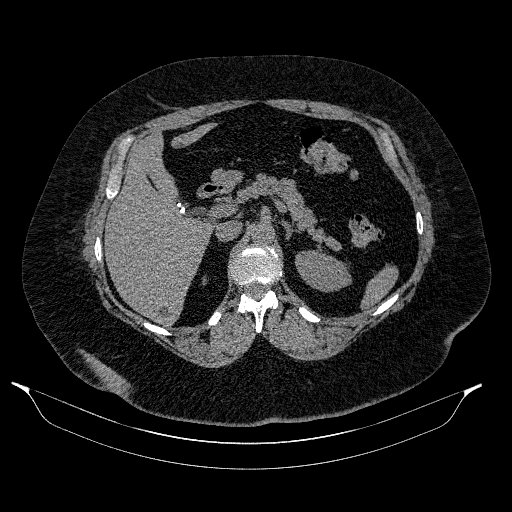
[im 19/357  lung]
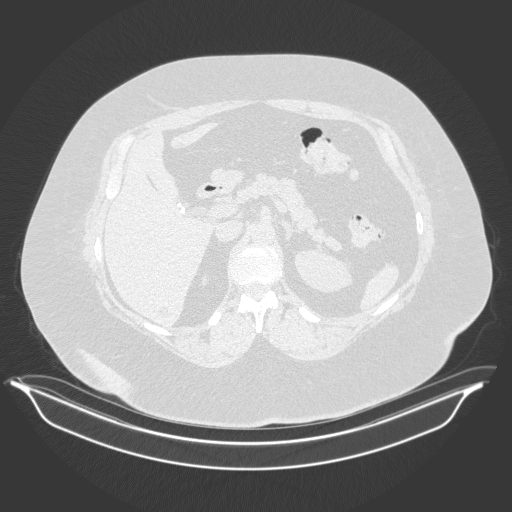
[im 57/357  lung]
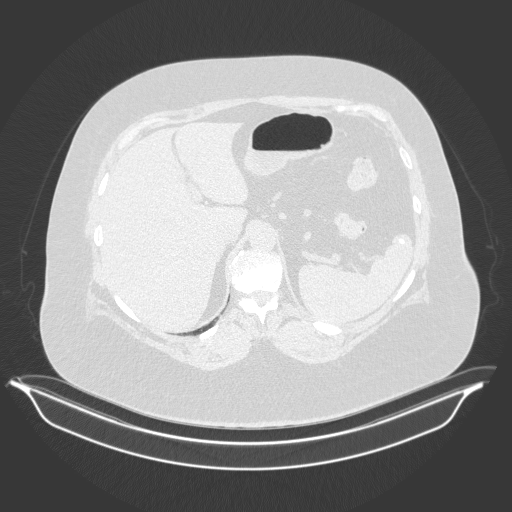
[im 75/357  lung]
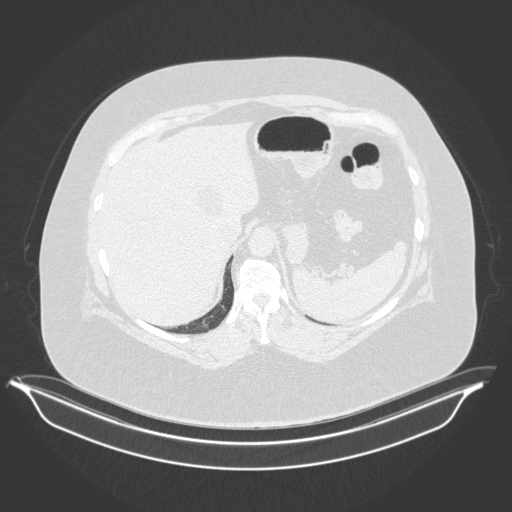
[im 113/357  lung]
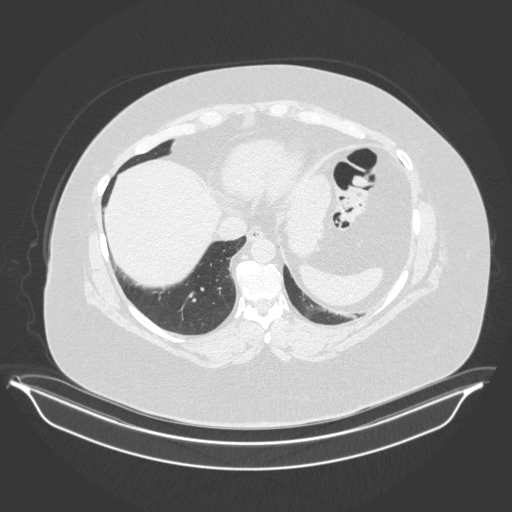
[im 119/357  mediastinal]
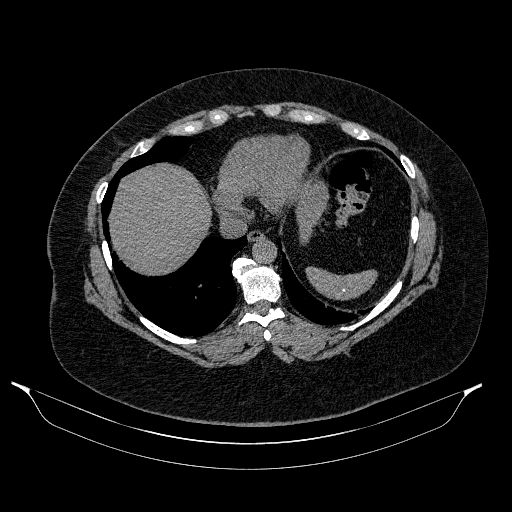
[im 119/357  lung]
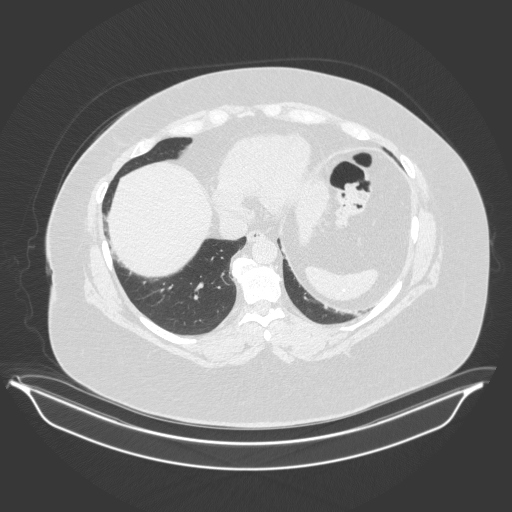
[im 150/357  lung]
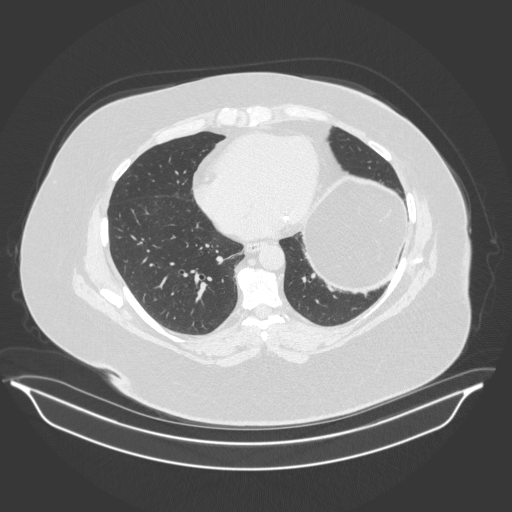
[im 167/357  lung]
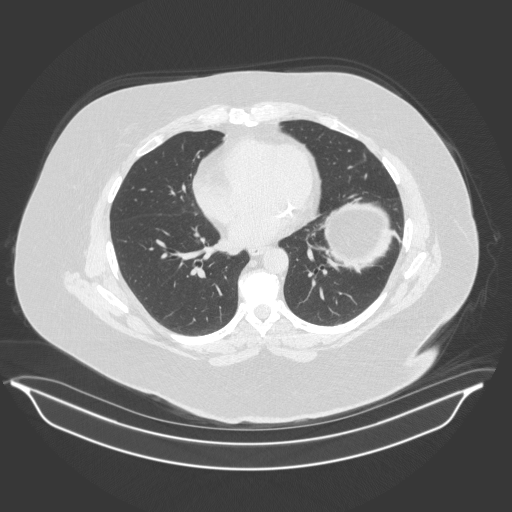
[im 188/357  lung]
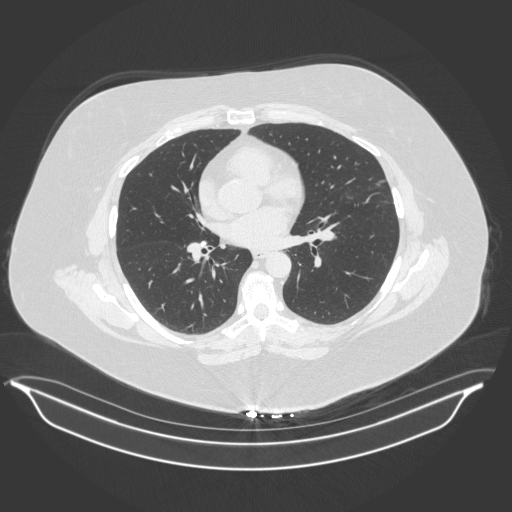
[im 207/357  mediastinal]
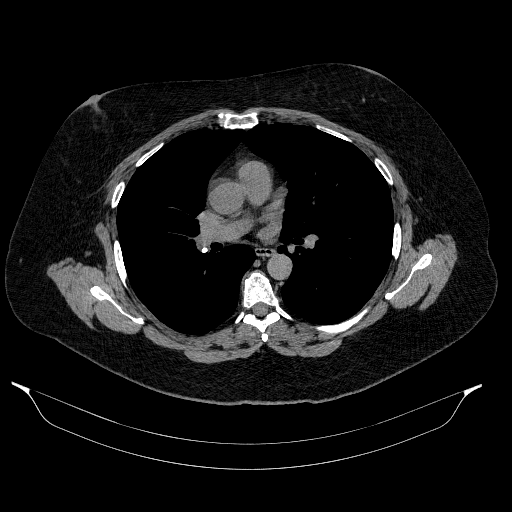
[im 207/357  lung]
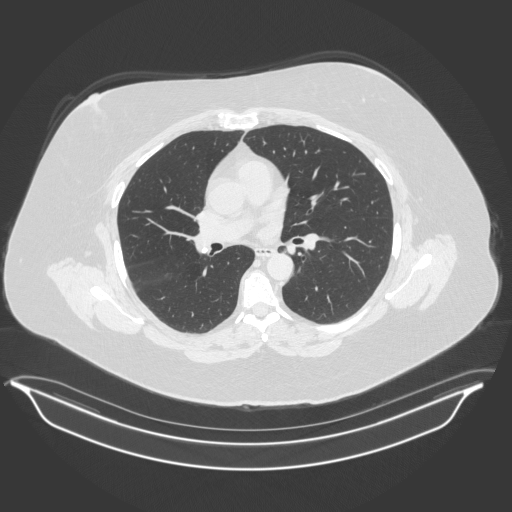
[im 238/357  lung]
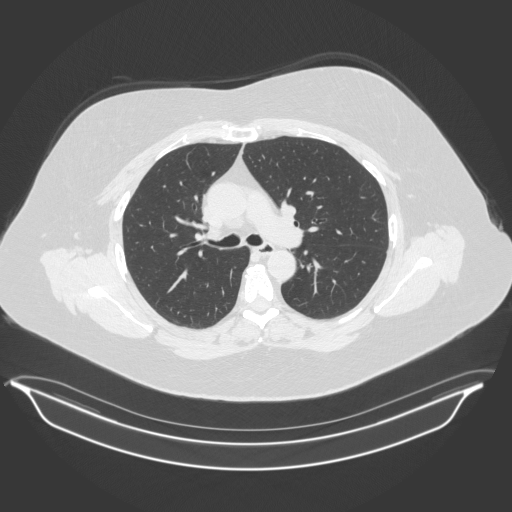
[im 244/357  lung]
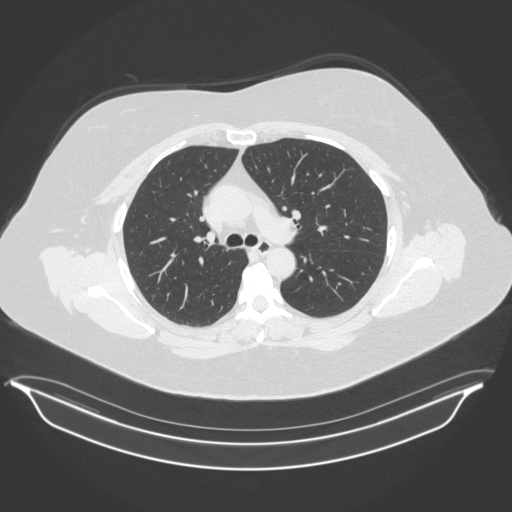
[im 282/357  lung]
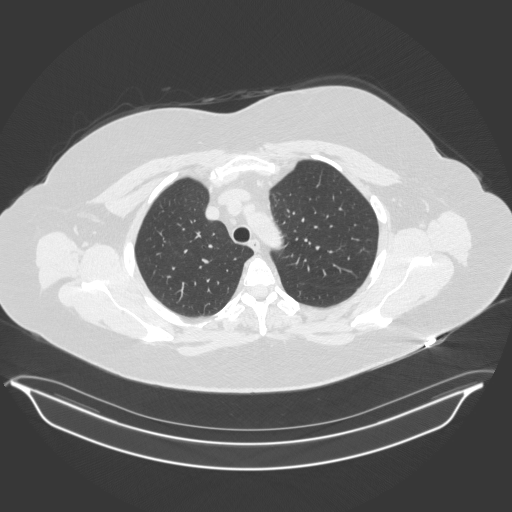
[im 300/357  mediastinal]
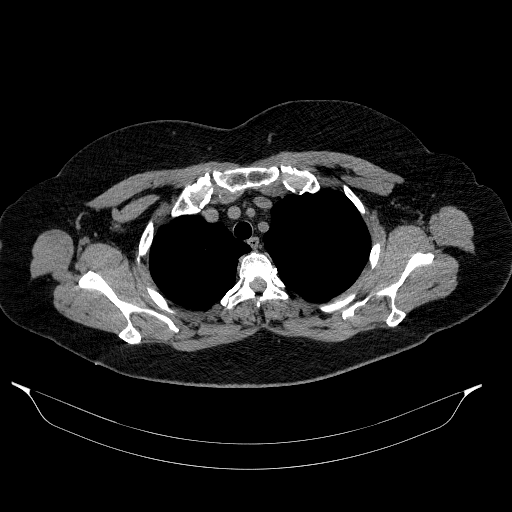
[im 300/357  lung]
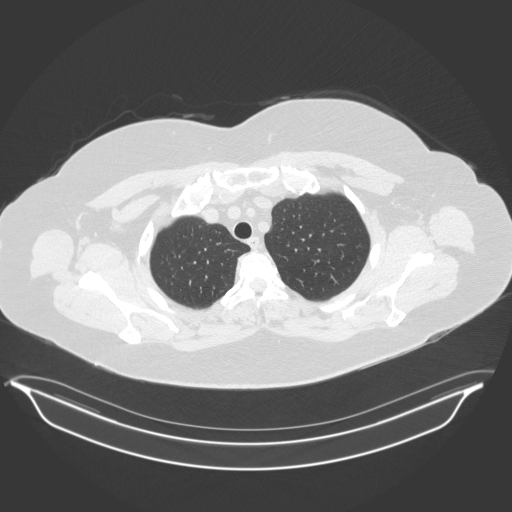
[im 338/357  lung]
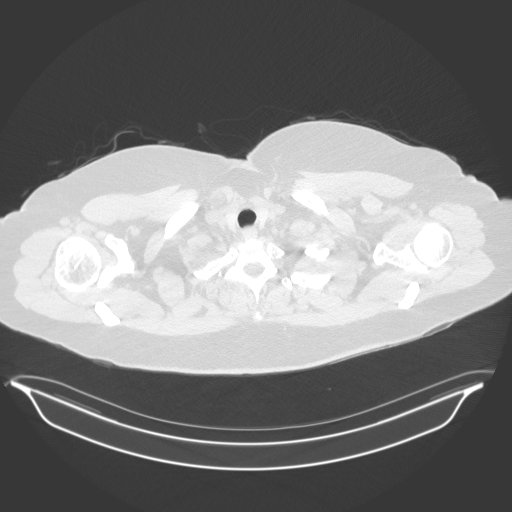

[14 of 32 positions shown; findings below may reference images not displayed]

FINDINGS: Cardiovascular: Dense calcifications are seen in the mitral annulus.
Coronary artery calcifications are seen. There is ectasia of
ascending thoracic aorta measuring 3.6 cm.

Mediastinum/Nodes: No significant lymphadenopathy seen. There are
small calcified nodes in the right hilum.

Lungs/Pleura: In the image 65 of series 5, densely calcified 8 mm
nodule in the right lower lobe which corresponds to the right
parahilar nodule seen in the chest radiographs. In image 74 of
series 5, there is 6 mm noncalcified nodule near the dome of left
hemidiaphragm, possibly in the left lower lobe. Left hemidiaphragm
is elevated. Linear patchy infiltrates are seen in the lingula and
left lower lobe suggesting scarring or subsegmental atelectasis.
Small linear densities seen in the right lower lobe in the posterior
costophrenic angle, possibly scarring. There is no focal pulmonary
consolidation. There is no pleural effusion or pneumothorax.

Upper Abdomen: There are few low-density lesions in the liver
largest measuring 3.3 cm possibly cysts or hemangiomas. Surgical
clips are seen in gallbladder fossa. Diverticula are seen in the
visualized portions of colon. Calcified granulomas are seen in the
spleen.

Musculoskeletal: Unremarkable.
IMPRESSION: There is 8 mm densely calcified nodule in the right lower lobe
suggesting healed granuloma. This calcified nodule corresponds to
the lung nodule seen in the chest radiographs. There are calcified
lymph nodes in the right hilum.

There is 6 mm noncalcified nodule in the left lower lobe close to
the dome of left hemidiaphragm. Non-contrast chest CT at 6-12 months
is recommended. If the nodule is stable at time of repeat CT, then
future CT at 18-24 months (from today's scan) is considered optional
for low-risk patients, but is recommended for high-risk patients.
This recommendation follows the consensus statement: Guidelines for
Management of Incidental Pulmonary Nodules Detected on CT Images:

Elevation of left hemidiaphragm may be due to eventration or
paralysis. There are linear densities in both lower lung fields,
more so on the left side suggesting scarring or subsegmental
atelectasis.

Coronary artery calcifications are seen. There is ectasia of
ascending thoracic aorta.

There are multiple low-density lesions in the liver, possibly cysts
or hemangiomas. Diverticulosis of colon.

## 2024-05-10 ENCOUNTER — Encounter: Payer: BC Managed Care – PPO | Admitting: Family Medicine
